# Patient Record
Sex: Female | Born: 1935
Health system: Southern US, Community
[De-identification: ages and names within clinical notes are randomized; demographics above are authoritative.]

## PROBLEM LIST (undated history)

## (undated) DIAGNOSIS — F419 Anxiety disorder, unspecified: Secondary | ICD-10-CM

## (undated) DIAGNOSIS — J189 Pneumonia, unspecified organism: Secondary | ICD-10-CM

## (undated) DIAGNOSIS — R0600 Dyspnea, unspecified: Secondary | ICD-10-CM

## (undated) DIAGNOSIS — I509 Heart failure, unspecified: Secondary | ICD-10-CM

## (undated) DIAGNOSIS — E7800 Pure hypercholesterolemia, unspecified: Secondary | ICD-10-CM

## (undated) DIAGNOSIS — J849 Interstitial pulmonary disease, unspecified: Secondary | ICD-10-CM

## (undated) DIAGNOSIS — I10 Essential (primary) hypertension: Secondary | ICD-10-CM

## (undated) DIAGNOSIS — G4730 Sleep apnea, unspecified: Secondary | ICD-10-CM

## (undated) DIAGNOSIS — I499 Cardiac arrhythmia, unspecified: Secondary | ICD-10-CM

## (undated) HISTORY — DX: Pure hypercholesterolemia, unspecified: E78.00

## (undated) HISTORY — DX: Sleep apnea, unspecified: G47.30

## (undated) HISTORY — DX: Interstitial pulmonary disease, unspecified: J84.9

## (undated) HISTORY — DX: Essential (primary) hypertension: I10

## (undated) HISTORY — PX: ACHILLES TENDON SURGERY: SHX542

## (undated) HISTORY — DX: Pneumonia, unspecified organism: J18.9

## (undated) HISTORY — DX: Anxiety disorder, unspecified: F41.9

## (undated) HISTORY — DX: Heart failure, unspecified: I50.9

## (undated) HISTORY — DX: Cardiac arrhythmia, unspecified: I49.9

## (undated) HISTORY — DX: Dyspnea, unspecified: R06.00

## (undated) HISTORY — PX: BACK SURGERY: SHX140

## (undated) HISTORY — PX: ABDOMINAL HYSTERECTOMY: SHX81

---

## 1998-08-01 ENCOUNTER — Ambulatory Visit (HOSPITAL_BASED_OUTPATIENT_CLINIC_OR_DEPARTMENT_OTHER): Admission: RE | Admit: 1998-08-01 | Discharge: 1998-08-01 | Payer: Self-pay | Admitting: Surgery

## 2004-10-03 ENCOUNTER — Inpatient Hospital Stay (HOSPITAL_COMMUNITY): Admission: RE | Admit: 2004-10-03 | Discharge: 2004-10-08 | Payer: Self-pay | Admitting: Neurosurgery

## 2006-10-02 IMAGING — CR DG CHEST 2V
2 series · 2 of 2 positions shown · non-contrast
Comparison: None.

CLINICAL DATA: Pre-admission chest x-ray for L4-5, PLIF.  
 CHEST - 2 VIEW:

[view not recorded (1 of 2)]
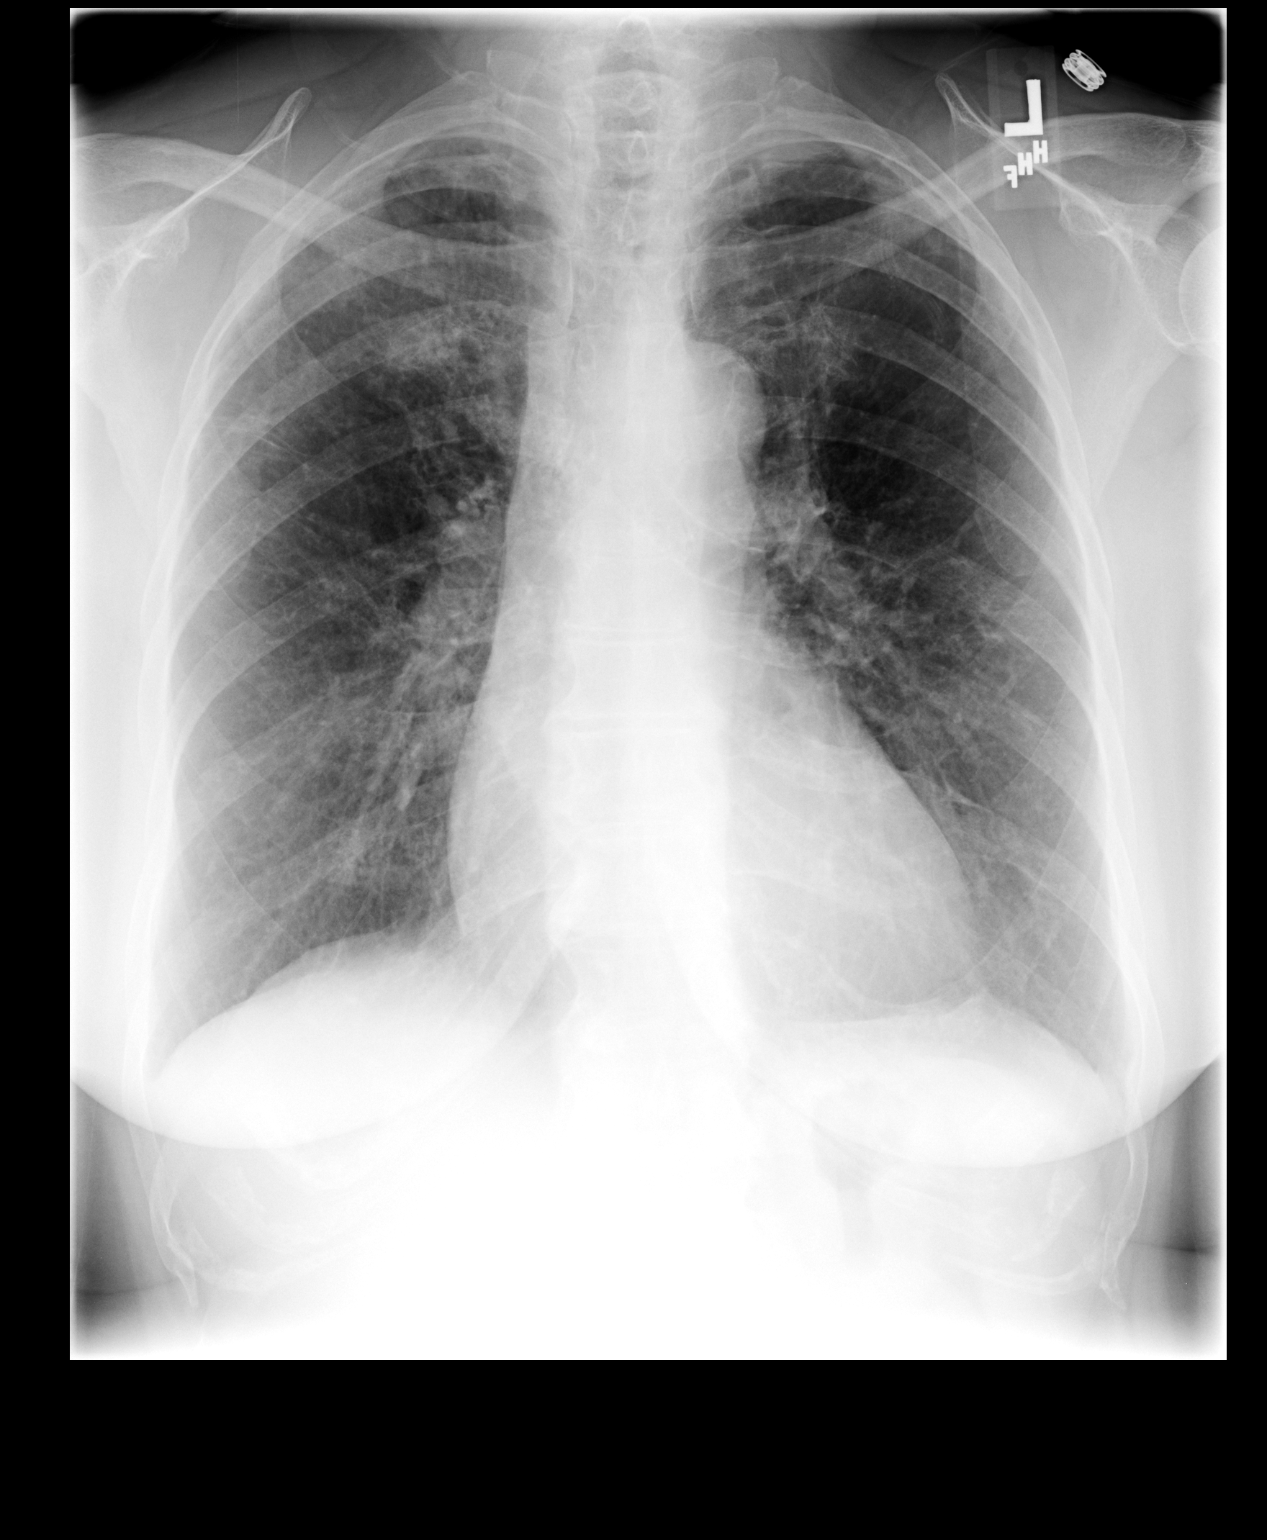

[view not recorded (2 of 2)]
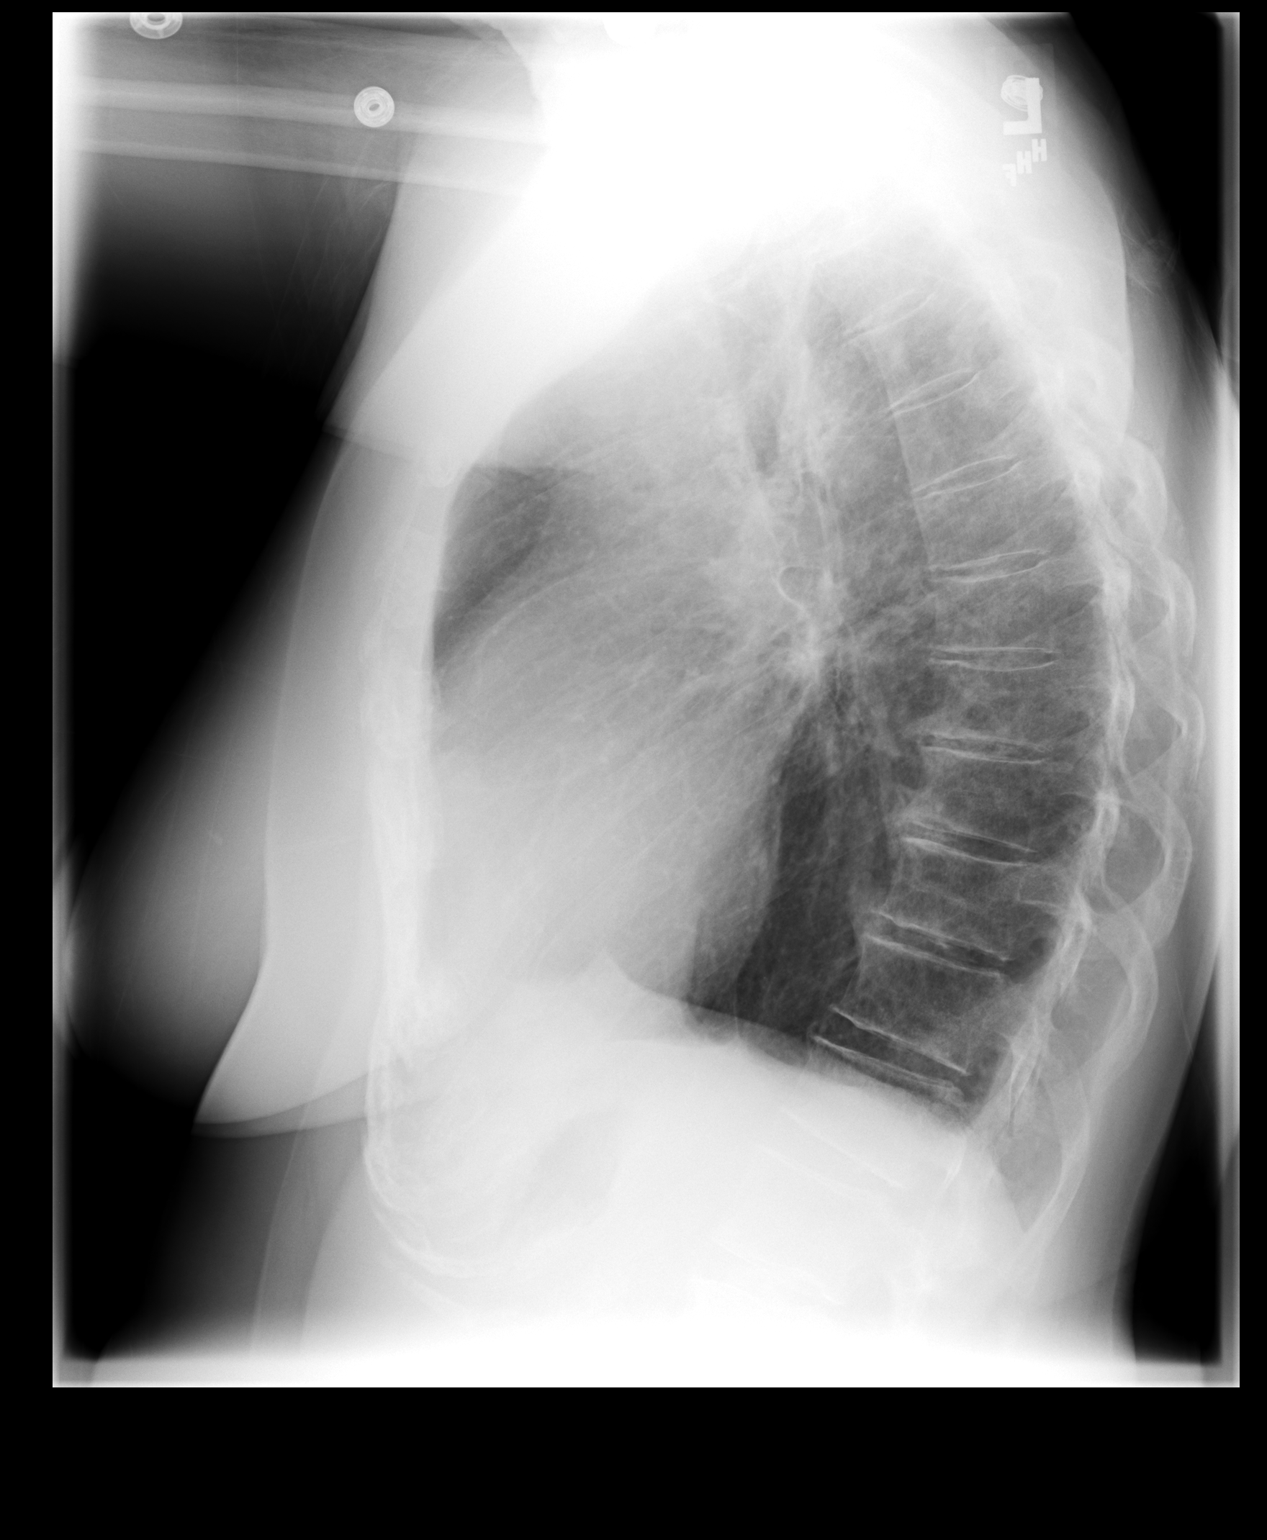

[2 of 2 positions shown; findings below may reference images not displayed]

The lungs are hyperinflated with diffuse interstitial coarsening bilaterally.  Focal area of ill-defined opacity is identified in the right upper lobe. Cardiopericardial silhouette is borderline enlarged. The bones are diffusely osteopenic.
IMPRESSION: 1.  Focal density in the right upper lobe may be related to scarring, but neoplasm is not excluded.  Comparison to previous chest x-rays is recommended and if none are available, uninfused CT scanning of the chest would be helpful to further evaluate. 
 2.  Chronic interstitial lung disease.

## 2011-04-03 ENCOUNTER — Encounter (INDEPENDENT_AMBULATORY_CARE_PROVIDER_SITE_OTHER): Payer: Self-pay | Admitting: Surgery

## 2013-10-23 ENCOUNTER — Ambulatory Visit (INDEPENDENT_AMBULATORY_CARE_PROVIDER_SITE_OTHER): Payer: Commercial Managed Care - HMO | Admitting: Cardiovascular Disease

## 2013-10-23 ENCOUNTER — Encounter: Payer: Self-pay | Admitting: Cardiovascular Disease

## 2013-10-23 VITALS — BP 160/84 | HR 59 | Ht 65.5 in | Wt 154.8 lb

## 2013-10-23 DIAGNOSIS — I4891 Unspecified atrial fibrillation: Secondary | ICD-10-CM | POA: Insufficient documentation

## 2013-10-23 LAB — BASIC METABOLIC PANEL
BUN: 19 mg/dL (ref 6–23)
CHLORIDE: 101 meq/L (ref 96–112)
CO2: 30 mEq/L (ref 19–32)
CREATININE: 1.3 mg/dL — AB (ref 0.4–1.2)
Calcium: 9.7 mg/dL (ref 8.4–10.5)
GFR: 40.48 mL/min — ABNORMAL LOW (ref >60.00)
GLUCOSE: 96 mg/dL (ref 70–99)
Potassium: 5.3 mEq/L — ABNORMAL HIGH (ref 3.5–5.1)
Sodium: 139 mEq/L (ref 135–145)

## 2013-10-23 LAB — CBC WITH DIFFERENTIAL/PLATELET
BASOS ABS: 0 10*3/uL (ref 0.0–0.1)
Basophils Relative: 0.3 % (ref 0.0–3.0)
Eosinophils Absolute: 0.1 10*3/uL (ref 0.0–0.7)
Eosinophils Relative: 0.9 % (ref 0.0–5.0)
HEMATOCRIT: 40.3 % (ref 36.0–46.0)
HEMOGLOBIN: 13.3 g/dL (ref 12.0–15.0)
LYMPHS ABS: 1.9 10*3/uL (ref 0.7–4.0)
Lymphocytes Relative: 18.7 % (ref 12.0–46.0)
MCHC: 33.1 g/dL (ref 30.0–36.0)
MCV: 87.2 fl (ref 78.0–100.0)
MONO ABS: 0.9 10*3/uL (ref 0.1–1.0)
Monocytes Relative: 8.8 % (ref 3.0–12.0)
Neutro Abs: 7.4 10*3/uL (ref 1.4–7.7)
Neutrophils Relative %: 71.3 % (ref 43.0–77.0)
Platelets: 280 10*3/uL (ref 150.0–400.0)
RBC: 4.63 Mil/uL (ref 3.87–5.11)
RDW: 14.7 % (ref 11.5–15.5)
WBC: 10.3 10*3/uL (ref 4.0–10.5)

## 2013-10-23 LAB — HEPATIC FUNCTION PANEL
ALBUMIN: 3.8 g/dL (ref 3.5–5.2)
ALT: 29 U/L (ref 0–35)
AST: 27 U/L (ref 0–37)
Alkaline Phosphatase: 48 U/L (ref 39–117)
BILIRUBIN DIRECT: 0 mg/dL (ref 0.0–0.3)
BILIRUBIN TOTAL: 0.7 mg/dL (ref 0.2–1.2)
TOTAL PROTEIN: 7.3 g/dL (ref 6.0–8.3)

## 2013-10-23 LAB — TSH: TSH: 2.18 u[IU]/mL (ref 0.35–4.50)

## 2013-10-23 NOTE — Patient Instructions (Addendum)
Your physician has recommended you make the following change in your medication:  STOP Amiodarone  Your physician recommends that you have lab work:  TODAY  Your physician recommends that you schedule a follow-up appointment in: 4-6 weeks - Tuesday June 30 at 4:30 pm.

## 2013-10-23 NOTE — Progress Notes (Signed)
     [place] Date of Birth  1932-04-01       [facility]    [employer] [address] [place], [place], [place]  [address]   [place], [place]  [address] [address] [place]   Fax  [phone]     Fax [phone]  Problem List: 1. Atrial fibrillation 2. Hypertension  3. Hyperlipidemia  History of Present Illness:  Rebecca Robinson presents in transition form [place] for treatment of her Atrial fib.    She has had 2 cardioversions - 1 without any antiarrhythmics, then later with amiodarone.    She now complains of having no energy.   She was not on any cardiac meds prior to her cardioversion.     She was very active before getting diagnosed with atrial fib.   She had occasional episodes of dyspnea, sweats ( which was probably atrial fib)   Since the dx, she has been on several meds and feels terrible.  She feels like a zombi.   She has felt very poorly for the past 3 weeks..    She has some Cp. She was hospitalized in [place] - stress myoview was negative.    She has tried diltiazem and digoxin but these have been stopped.   nonsmoker  FHx:  negative for heart problems.     Current Outpatient Prescriptions  Medication Sig Dispense Refill  . amiodarone (PACERONE) 200 MG tablet       . carvedilol (COREG) 3.125 MG tablet       . ELIQUIS 5 MG TABS tablet       . lisinopril-hydrochlorothiazide (PRINZIDE,ZESTORETIC) 10-12.5 MG per tablet       . pravastatin (PRAVACHOL) 80 MG tablet        No current facility-administered medications for this visit.     Allergies  Allergen Reactions  . Lipitor [Atorvastatin]   . Niacin And Related     No past medical history on file.  No past surgical history on file.  History  Smoking status  . Never Smoker   Smokeless tobacco  . Not on file    History  Alcohol Use: Not on file    No family history on file.  Reviw of Systems:  Reviewed in the HPI.  All other  systems are negative.  Physical Exam: Blood pressure 160/84, pulse 59, height 5' 5.5" (1.664 m), weight 154 lb 12.8 oz (70.217 kg). Wt Readings from Last 3 Encounters:  10/23/13 154 lb 12.8 oz (70.217 kg)     General: Well developed, well nourished, in no acute distress.  Head: Normocephalic, atraumatic, sclera non-icteric, mucus membranes are moist,   Neck: Supple. Carotids are 2 + without bruits. No JVD   Lungs: Clear   Heart: RR, normal S1s2  Abdomen: Soft, non-tender, non-distended with normal bowel sounds.  Msk:  Strength and tone are normal   Extremities: No clubbing or cyanosis. No edema.  Distal pedal pulses are 2+ and equal    Neuro: CN II - XII intact.  Alert and oriented X 3.   Psych:  Normal  ECG: Oct 23, 2013:  Sinus brady at 22.    Assessment / Plan:

## 2013-10-23 NOTE — Assessment & Plan Note (Signed)
Mrs. Denice Paradise presents today for further evaluation and management of her atrial fibrillation. She's been tried on diltiazem and digoxin in the past but wasn't started on amiodarone. She's had 2 cardioversions. The second cardioversion while on amiodarone with successful.  For the past several weeks she's had gradual fatigue and malaise. She does not feel like doing anything.  I suspect that this may be due to the amiodarone. We will discontinue the amiodarone and see if she gradually improved. We'll continue with rate control using Coreg. We might have to change to metoprolol for better heart rate control. She is to continue with Eliquis.    She's already had an echocardiogram and stress Myoview study performed at [facility]. We will request those records. To my knowledge, she's not received any other antiarrhythmic medication.  We'll check labs today including CBC, basic metabolic profile, TSH, and liver enzymes.

## 2013-10-26 ENCOUNTER — Telehealth: Payer: Self-pay | Admitting: Cardiovascular Disease

## 2013-10-26 NOTE — Telephone Encounter (Signed)
ROI faxed to [facility] [phone]  [phone]/kdm

## 2013-10-26 NOTE — Telephone Encounter (Signed)
Records rec From [facility] gave to Operator Room

## 2013-12-05 ENCOUNTER — Encounter: Payer: Self-pay | Admitting: Cardiovascular Disease

## 2013-12-05 ENCOUNTER — Ambulatory Visit (INDEPENDENT_AMBULATORY_CARE_PROVIDER_SITE_OTHER): Payer: Medicare HMO | Admitting: Cardiovascular Disease

## 2013-12-05 VITALS — BP 180/86 | HR 56 | Ht 65.5 in | Wt 158.0 lb

## 2013-12-05 DIAGNOSIS — I480 Paroxysmal atrial fibrillation: Secondary | ICD-10-CM

## 2013-12-05 DIAGNOSIS — I10 Essential (primary) hypertension: Secondary | ICD-10-CM

## 2013-12-05 DIAGNOSIS — I482 Chronic atrial fibrillation, unspecified: Secondary | ICD-10-CM

## 2013-12-05 DIAGNOSIS — I4891 Unspecified atrial fibrillation: Secondary | ICD-10-CM

## 2013-12-05 MED ORDER — CARVEDILOL 3.125 MG PO TABS: 3.1250 mg | ORAL_TABLET | Freq: Two times a day (BID) | ORAL | Status: DC

## 2013-12-05 MED ORDER — APIXABAN 5 MG PO TABS: 5.0000 mg | ORAL_TABLET | Freq: Two times a day (BID) | ORAL | Status: DC

## 2013-12-05 MED ORDER — LISINOPRIL-HYDROCHLOROTHIAZIDE 10-12.5 MG PO TABS: 0.5000 | ORAL_TABLET | Freq: Every day | ORAL | Status: DC

## 2013-12-05 MED ORDER — PRAVASTATIN SODIUM 80 MG PO TABS: 80.0000 mg | ORAL_TABLET | Freq: Every day | ORAL | Status: DC

## 2013-12-05 NOTE — Assessment & Plan Note (Signed)
Her blood pressures are elevated today. She eats a very high salt diet. He had long discussion regarding low-salt options. We will have her continue to follow her blood pressure. I'll see her in 3 months.

## 2013-12-05 NOTE — Patient Instructions (Addendum)
Your physician recommends that you continue on your current medications as directed. Please refer to the Current Medication list given to you today.  Your physician recommends that you schedule a follow-up appointment in: 3 months - Tuesday Sept. 22 @ 3:45 pm

## 2013-12-05 NOTE — Assessment & Plan Note (Signed)
She seems to be much better since we stopped the amiodarone. We'll continue to follow her

## 2013-12-05 NOTE — Progress Notes (Signed)
[place] Date of Birth  1931-07-10       [facility]    [employer] [address], [place], [place]  [address]   [place], [place]  [address] [place]   Fax  [phone]     Fax [phone]  Problem List: 1. Atrial fibrillation 2. Hypertension  3. Hyperlipidemia  History of Present Illness:  Ms. Trabert presents in transition form [place] for treatment of her Atrial fib.    She has had 2 cardioversions - 1 without any antiarrhythmics, then later with amiodarone.    She now complains of having no energy.   She was not on any cardiac meds prior to her cardioversion.     She was very active before getting diagnosed with atrial fib.   She had occasional episodes of dyspnea, sweats ( which was probably atrial fib)   Since the dx, she has been on several meds and feels terrible.  She feels like a zombi.   She has felt very poorly for the past 3 weeks..    She has some Cp. She was hospitalized in [place] - stress myoview was negative.    She has tried diltiazem and digoxin but these have been stopped.   nonsmoker  FHx:  negative for heart problems.    December 05, 2013:  Ms. Sammons is doing well.  Is still in NSR.    She is not exercising - she is active doing her usual activities.     Current Outpatient Prescriptions  Medication Sig Dispense Refill  . Calcium Carbonate-Vitamin D (CALCIUM-VITAMIN D) 500-200 MG-UNIT per tablet Take 1 tablet by mouth daily.      . carvedilol (COREG) 3.125 MG tablet       . ELIQUIS 5 MG TABS tablet       . lisinopril-hydrochlorothiazide (PRINZIDE,ZESTORETIC) 10-12.5 MG per tablet       . pravastatin (PRAVACHOL) 80 MG tablet        No current facility-administered medications for this visit.     Allergies  Allergen Reactions  . Lipitor [Atorvastatin]   . Niacin And Related     No past medical history on file.  No past surgical history on file.  History    Smoking status  . Never Smoker   Smokeless tobacco  . Not on file    History  Alcohol Use: Not on file    No family history on file.  Reviw of Systems:  Reviewed in the HPI.  All other systems are negative.  Physical Exam: Blood pressure 180/86, pulse 56, height 5' 5.5" (1.664 m), weight 158 lb (71.668 kg). Wt Readings from Last 3 Encounters:  12/05/13 158 lb (71.668 kg)  10/23/13 154 lb 12.8 oz (70.217 kg)     General: Well developed, well nourished, in no acute distress.  Head: Normocephalic, atraumatic, sclera non-icteric, mucus membranes are moist,   Neck: Supple. Carotids are 2 + without bruits. No JVD   Lungs: Clear   Heart: RR, normal S1s2  Abdomen: Soft, non-tender, non-distended with normal bowel sounds.  Msk:  Strength and tone are normal   Extremities: No clubbing or cyanosis. No edema.  Distal pedal pulses are 2+ and equal    Neuro: CN II - XII intact.  Alert and oriented X 3.   Psych:  Normal  ECG: Oct 23, 2013:  Sinus brady at 7.    Assessment / Plan:

## 2013-12-06 ENCOUNTER — Telehealth: Payer: Self-pay | Admitting: Cardiovascular Disease

## 2013-12-06 NOTE — Telephone Encounter (Signed)
**Note De-Identified Rebecca Robinson Obfuscation** The pts Lisinopril/HCTZ is correct in her medications list.

## 2013-12-06 NOTE — Telephone Encounter (Signed)
New Message:  Pt states she heard back from her doctor in [place] and her lisinopril Rx.. She only takes a half a pill.. Not a whole pill... Pt just wanted to give Dr. Acie Fredrickson an Juluis Rainier

## 2014-01-01 ENCOUNTER — Other Ambulatory Visit: Payer: Self-pay | Admitting: *Deleted

## 2014-01-01 MED ORDER — CARVEDILOL 3.125 MG PO TABS: 3.1250 mg | ORAL_TABLET | Freq: Two times a day (BID) | ORAL | Status: DC

## 2014-01-08 ENCOUNTER — Telehealth: Payer: Self-pay | Admitting: Cardiovascular Disease

## 2014-01-08 NOTE — Telephone Encounter (Signed)
New message    patient calling need clarification on mediation carvedilol  3.12 mg

## 2014-01-08 NOTE — Telephone Encounter (Signed)
Spoke with patient who states she took 1/2 tab of Carvedilol twice daily in the past and now her Rx bottle says to take 1 pill twice daily.  I reviewed the records that I have in patient's chart and advised her that according to her last office visit with Dr. Acie Fredrickson on 6/30, patient is supposed to take Carvedilol 3.125 mg twice daily.  I suggested that patient may have been on a higher dose sometime in the past and was told to take 1/2 a tablet when she was reduced to 3.125 mg.  Patient states she does not know but she agrees to take Carvedilol 3.125 mg twice daily.  Patient also had questions about refills sent on 6/30 and I reviewed that information with patient until she had understanding and agreement.

## 2014-02-27 ENCOUNTER — Encounter: Payer: Self-pay | Admitting: Cardiovascular Disease

## 2014-02-27 ENCOUNTER — Ambulatory Visit (INDEPENDENT_AMBULATORY_CARE_PROVIDER_SITE_OTHER): Payer: Medicare HMO | Admitting: Cardiovascular Disease

## 2014-02-27 VITALS — BP 144/86 | HR 49 | Ht 65.5 in | Wt 157.8 lb

## 2014-02-27 DIAGNOSIS — J9801 Acute bronchospasm: Secondary | ICD-10-CM

## 2014-02-27 DIAGNOSIS — I4891 Unspecified atrial fibrillation: Secondary | ICD-10-CM

## 2014-02-27 DIAGNOSIS — I482 Chronic atrial fibrillation, unspecified: Secondary | ICD-10-CM

## 2014-02-27 MED ORDER — ALBUTEROL SULFATE HFA 108 (90 BASE) MCG/ACT IN AERS: 2.0000 | INHALATION_SPRAY | Freq: Three times a day (TID) | RESPIRATORY_TRACT | Status: DC | PRN

## 2014-02-27 NOTE — Assessment & Plan Note (Signed)
She's having some bronchospasm/wheezing on the right side. We'll give her albuterol HFA 2 puffs every 8 hours as needed. She is further pulmonary issues, I have encouraged her to see her medical Dr.

## 2014-02-27 NOTE — Assessment & Plan Note (Signed)
Her atrial fibrillation seems to be well controlled.  Continue current meds.

## 2014-02-27 NOTE — Progress Notes (Signed)
[place] Date of Birth  1932-03-15       [facility]    [employer] [address] [place], [place], [place]  [address]   [place], [place]  [address] [place]   Fax  [phone]     Fax [phone]  Problem List: 1. Atrial fibrillation 2. Hypertension  3. Hyperlipidemia  History of Present Illness:  Rebecca Robinson presents in transition form [place] for treatment of her Atrial fib.    She has had 2 cardioversions - 1 without any antiarrhythmics, then later with amiodarone.    She now complains of having no energy.   She was not on any cardiac meds prior to her cardioversion.     She was very active before getting diagnosed with atrial fib.   She had occasional episodes of dyspnea, sweats ( which was probably atrial fib)   Since the dx, she has been on several meds and feels terrible.  She feels like a zombi.   She has felt very poorly for the past 3 weeks..    She has some Cp. She was hospitalized in [place] - stress myoview was negative.    She has tried diltiazem and digoxin but these have been stopped.   nonsmoker  FHx:  negative for heart problems.    December 05, 2013:  Ms. Spies is doing well.  Is still in NSR.    She is not exercising - she is active doing her usual activities.    Sept. 22,2015:  She is avoiding some extra salt reading labels .     Current Outpatient Prescriptions  Medication Sig Dispense Refill  . apixaban (ELIQUIS) 5 MG TABS tablet Take 1 tablet (5 mg total) by mouth 2 (two) times daily.  180 tablet  3  . Calcium Carbonate-Vitamin D (CALCIUM-VITAMIN D) 500-200 MG-UNIT per tablet Take 1 tablet by mouth daily.      . carvedilol (COREG) 3.125 MG tablet Take 1 tablet (3.125 mg total) by mouth 2 (two) times daily with a meal.  180 tablet  3  . lisinopril-hydrochlorothiazide (PRINZIDE,ZESTORETIC) 10-12.5 MG per tablet Take 0.5 tablets by mouth daily.  90 tablet  3  . pravastatin  (PRAVACHOL) 80 MG tablet Take 1 tablet (80 mg total) by mouth daily.  90 tablet  3   No current facility-administered medications for this visit.     Allergies  Allergen Reactions  . Lipitor [Atorvastatin]   . Niacin And Related     No past medical history on file.  No past surgical history on file.  History  Smoking status  . Never Smoker   Smokeless tobacco  . Not on file    History  Alcohol Use: Not on file    No family history on file.  Reviw of Systems:  Reviewed in the HPI.  All other systems are negative.  Physical Exam: Blood pressure 144/86, pulse 49, height 5' 5.5" (1.664 m), weight 157 lb 12.8 oz (71.578 kg), SpO2 98.00%. Wt Readings from Last 3 Encounters:  02/27/14 157 lb 12.8 oz (71.578 kg)  12/05/13 158 lb (71.668 kg)  10/23/13 154 lb 12.8 oz (70.217 kg)     General: Well developed, well nourished, in no acute distress.  Head: Normocephalic, atraumatic, sclera non-icteric, mucus membranes are moist,   Neck: Supple. Carotids are 2 + without bruits. No JVD   Lungs: wheezes, especially in the right side  Heart: RR, normal S1s2  Abdomen: Soft, non-tender, non-distended with normal bowel sounds.  Msk:  Strength and tone are normal   Extremities: No clubbing or cyanosis. No edema.  Distal pedal pulses are 2+ and equal    Neuro: CN II - XII intact.  Alert and oriented X 3.   Psych:  Normal  ECG:   Assessment / Plan:

## 2014-02-27 NOTE — Patient Instructions (Signed)
Your physician has recommended you make the following change in your medication:  START Proair inhaler 2 puffs every 8 hours as needed  Your physician wants you to follow-up in: 6 months with Dr. Acie Fredrickson.  You will receive a reminder letter in the mail two months in advance. If you don't receive a letter, please call our office to schedule the follow-up appointment.

## 2014-02-28 ENCOUNTER — Telehealth: Payer: Self-pay | Admitting: Cardiovascular Disease

## 2014-02-28 NOTE — Telephone Encounter (Signed)
ERROR

## 2014-03-29 ENCOUNTER — Other Ambulatory Visit: Payer: Self-pay | Admitting: *Deleted

## 2014-03-29 MED ORDER — PRAVASTATIN SODIUM 80 MG PO TABS: 80.0000 mg | ORAL_TABLET | Freq: Every day | ORAL | Status: DC

## 2014-08-27 ENCOUNTER — Encounter: Payer: Self-pay | Admitting: Cardiovascular Disease

## 2014-08-27 ENCOUNTER — Ambulatory Visit (INDEPENDENT_AMBULATORY_CARE_PROVIDER_SITE_OTHER): Payer: PPO | Admitting: Cardiovascular Disease

## 2014-08-27 VITALS — BP 154/80 | HR 52 | Ht 65.5 in | Wt 167.0 lb

## 2014-08-27 DIAGNOSIS — I480 Paroxysmal atrial fibrillation: Secondary | ICD-10-CM

## 2014-08-27 DIAGNOSIS — I4891 Unspecified atrial fibrillation: Secondary | ICD-10-CM

## 2014-08-27 DIAGNOSIS — I482 Chronic atrial fibrillation, unspecified: Secondary | ICD-10-CM

## 2014-08-27 DIAGNOSIS — I10 Essential (primary) hypertension: Secondary | ICD-10-CM

## 2014-08-27 NOTE — Patient Instructions (Signed)
Your physician recommends that you continue on your current medications as directed. Please refer to the Current Medication list given to you today.  Your physician has requested that you have an echocardiogram. Echocardiography is a painless test that uses sound waves to create images of your heart. It provides your doctor with information about the size and shape of your heart and how well your heart's chambers and valves are working. This procedure takes approximately one hour. There are no restrictions for this procedure.  Your physician wants you to follow-up in: 6 months with Dr. Acie Fredrickson.  You will receive a reminder letter in the mail two months in advance. If you don't receive a letter, please call our office to schedule the follow-up appointment.

## 2014-08-27 NOTE — Progress Notes (Signed)
Cardiology Office Note   Date:  08/27/2014   ID:  Rebecca Robinson, Rebecca Robinson, Rebecca Robinson  PCP:  Maris Berger, MD  Cardiologist:   Thayer Headings, MD   No chief complaint on file.  1. Atrial fibrillation 2. Hypertension  3. Hyperlipidemia  History of Present Illness:  Rebecca Robinson presents in transition form [place] for treatment of her Atrial fib. She has had 2 cardioversions - 1 without any antiarrhythmics, then later with amiodarone.   She now complains of having no energy. She was not on any cardiac meds prior to her cardioversion.    She was very active before getting diagnosed with atrial fib. She had occasional episodes of dyspnea, sweats ( which was probably atrial fib) Since the dx, she has been on several meds and feels terrible. She feels like a zombi. She has felt very poorly for the past 3 weeks..   She has some Cp. She was hospitalized in [place] - stress myoview was negative.   She has tried diltiazem and digoxin but these have been stopped.  nonsmoker  FHx: negative for heart problems.   December 05, 2013:  Rebecca Robinson is doing well. Is still in NSR. She is not exercising - she is active doing her usual activities.   Sept. 22,2015:  She is avoiding some extra salt reading labels .    August 27, 2014:    Rebecca Robinson is a 81 y.o. female who presents for follow up of her atrial fib. She has been under lots of stress. Husband has alzheimiers and she has been stressed out in managing him.  She has occasional episodes of atrial fib which makes her very short of breath.   She had an episode of severe dyspnea while getting dressed on day. The dyspnea has continued off and on.    [place]  [place] at the mall 3-4 times a week - 30-40 minutes at a time.     No past medical history on file.  No past surgical history on file.   Current Outpatient Prescriptions  Medication Sig Dispense Refill  . albuterol (PROVENTIL  HFA;VENTOLIN HFA) 108 (90 BASE) MCG/ACT inhaler Inhale 2 puffs into the lungs every 8 (eight) hours as needed for wheezing or shortness of breath. 1 Inhaler 6  . apixaban (ELIQUIS) 5 MG TABS tablet Take 1 tablet (5 mg total) by mouth 2 (two) times daily. 180 tablet 3  . Calcium Carbonate-Vitamin D (CALCIUM-VITAMIN D) 500-200 MG-UNIT per tablet Take 1 tablet by mouth daily.    . carvedilol (COREG) 3.125 MG tablet Take 1 tablet (3.125 mg total) by mouth 2 (two) times daily with a meal. 180 tablet 3  . lisinopril-hydrochlorothiazide (PRINZIDE,ZESTORETIC) 10-12.5 MG per tablet Take 0.5 tablets by mouth daily. 90 tablet 3  . pravastatin (PRAVACHOL) 80 MG tablet Take 1 tablet (80 mg total) by mouth daily. 90 tablet 3   No current facility-administered medications for this visit.    Allergies:   Lipitor and Niacin and related    Social History:  The patient  reports that she has never smoked. She does not have any smokeless tobacco history on file.   Family History:  The patient's family history is not on file.    ROS:  Please see the history of present illness.    Review of Systems: Constitutional:  denies fever, chills, diaphoresis, appetite change and fatigue.  HEENT: denies photophobia, eye pain, redness, hearing loss, ear pain, congestion, sore throat, rhinorrhea, sneezing, neck  pain, neck stiffness and tinnitus.  Respiratory: denies SOB, DOE, cough, chest tightness, and wheezing.  Cardiovascular: denies chest pain, palpitations and leg swelling.  Gastrointestinal: denies nausea, vomiting, abdominal pain, diarrhea, constipation, blood in stool.  Genitourinary: denies dysuria, urgency, frequency, hematuria, flank pain and difficulty urinating.  Musculoskeletal: denies  myalgias, back pain, joint swelling, arthralgias and gait problem.   Skin: denies pallor, rash and wound.  Neurological: denies dizziness, seizures, syncope, weakness, light-headedness, numbness and headaches.     Hematological: denies adenopathy, easy bruising, personal or family bleeding history.  Psychiatric/ Behavioral: denies suicidal ideation, mood changes, confusion, nervousness, sleep disturbance and agitation.       All other systems are reviewed and negative.    PHYSICAL EXAM: VS:  BP 154/80 mmHg  Pulse 52  Ht 5' 5.5" (1.664 m)  Wt 167 lb (75.751 kg)  BMI 27.36 kg/m2 , BMI Body mass index is 27.36 kg/(m^2). GEN: Well nourished, well developed, in no acute distress HEENT: normal Neck: no JVD, carotid bruits, or masses Cardiac: RRR; no murmurs, rubs, or gallops,no edema  Respiratory:  clear to auscultation bilaterally, normal work of breathing GI: soft, nontender, nondistended, + BS MS: no deformity or atrophy Skin: warm and dry, no rash Neuro:  Strength and sensation are intact Psych: normal   EKG:  EKG is not ordered today.    Recent Labs: 10/23/2013: ALT 29; BUN 19; Creatinine 1.3*; Hemoglobin 13.3; Platelets 280.0; Potassium 5.3*; Sodium 139; TSH 2.18    Lipid Panel No results found for: CHOL, TRIG, HDL, CHOLHDL, VLDL, LDLCALC, LDLDIRECT    Wt Readings from Last 3 Encounters:  08/27/14 167 lb (75.751 kg)  02/27/14 157 lb 12.8 oz (71.578 kg)  12/05/13 158 lb (71.668 kg)      Other studies Reviewed: Additional studies/ records that were reviewed today include: . Review of the above records demonstrates:    ASSESSMENT AND PLAN:  1. Atrial fibrillation- she continues to have palpitations. At times she has severe shortness breath. Were wondering whether or not these episodes of severe shortness of breath are associated with paroxysmal atrial fibrillation. She'll continue the same medications. I've instructed her to go to her medical doctor or come here if she has severe short of breath so that we get an EKG.  I'll see her again in 6 months.  2. Hypertension   3. Hyperlipidemia   Current medicines are reviewed at length with the patient today.  The patient  does not have concerns regarding medicines.  The following changes have been made:  no change  Labs/ tests ordered today include:  No orders of the defined types were placed in this encounter.     Disposition:   FU with me in 6 months.     Signed, Maliki Gignac, Wonda Cheng, MD  08/27/2014 3:46 PM    [address], [place], [place]  [address] Phone: [phone]; Fax: [phone]

## 2014-08-30 ENCOUNTER — Ambulatory Visit (HOSPITAL_COMMUNITY): Payer: PPO | Attending: Cardiology

## 2014-08-30 DIAGNOSIS — I10 Essential (primary) hypertension: Secondary | ICD-10-CM | POA: Insufficient documentation

## 2014-08-30 DIAGNOSIS — I4891 Unspecified atrial fibrillation: Secondary | ICD-10-CM | POA: Diagnosis not present

## 2014-08-30 DIAGNOSIS — I480 Paroxysmal atrial fibrillation: Secondary | ICD-10-CM

## 2014-08-30 NOTE — Progress Notes (Signed)
2D Echo completed. 08/30/2014 

## 2014-12-17 ENCOUNTER — Other Ambulatory Visit: Payer: Self-pay | Admitting: Cardiovascular Disease

## 2014-12-18 ENCOUNTER — Institutional Professional Consult (permissible substitution): Payer: PPO | Admitting: Pulmonary Disease

## 2014-12-18 ENCOUNTER — Other Ambulatory Visit: Payer: Self-pay | Admitting: Cardiovascular Disease

## 2014-12-22 ENCOUNTER — Other Ambulatory Visit: Payer: Self-pay | Admitting: Cardiovascular Disease

## 2014-12-31 ENCOUNTER — Telehealth: Payer: Self-pay | Admitting: Cardiovascular Disease

## 2014-12-31 NOTE — Telephone Encounter (Signed)
New Message  Pt requested to speak w/ Sharyn Lull concerning recent hopsital and what to do going forward. Please call back and discuss.

## 2014-12-31 NOTE — Telephone Encounter (Signed)
OK for patient to follow up with Dr. Bettina Gavia

## 2014-12-31 NOTE — Telephone Encounter (Signed)
Spoke with patient who states she has been in the hospital today to get her atrial fib under control and that she will have to change to cardiologist in [place] due to transportation issues.  Patient states she will be seeing Dr. Bettina Gavia in [place] but does not have an appointment yet.  I spoke with Barbette Reichmann in [facility] and she advised that his office will call for patient records when patient is scheduled.  Patient verbalized understanding and agreement and asked me to thank Dr. Acie Fredrickson for her and to tell him she appreciated his care for her over the years.

## 2015-01-07 ENCOUNTER — Ambulatory Visit (INDEPENDENT_AMBULATORY_CARE_PROVIDER_SITE_OTHER): Payer: PPO | Admitting: Pulmonary Disease

## 2015-01-07 ENCOUNTER — Encounter: Payer: Self-pay | Admitting: Pulmonary Disease

## 2015-01-07 ENCOUNTER — Ambulatory Visit (INDEPENDENT_AMBULATORY_CARE_PROVIDER_SITE_OTHER)
Admission: RE | Admit: 2015-01-07 | Discharge: 2015-01-07 | Disposition: A | Discharge status: Home / Self Care | Payer: PPO | Source: Ambulatory Visit | Attending: Pulmonary Disease | Admitting: Pulmonary Disease

## 2015-01-07 VITALS — BP 120/76 | HR 102 | Temp 97.3°F | Ht 65.5 in | Wt 168.0 lb

## 2015-01-07 DIAGNOSIS — I481 Persistent atrial fibrillation: Secondary | ICD-10-CM | POA: Diagnosis not present

## 2015-01-07 DIAGNOSIS — J8410 Pulmonary fibrosis, unspecified: Secondary | ICD-10-CM | POA: Diagnosis not present

## 2015-01-07 DIAGNOSIS — R0600 Dyspnea, unspecified: Secondary | ICD-10-CM | POA: Insufficient documentation

## 2015-01-07 DIAGNOSIS — I4819 Other persistent atrial fibrillation: Secondary | ICD-10-CM

## 2015-01-07 MED ORDER — BUDESONIDE 180 MCG/ACT IN AEPB: 2.0000 | INHALATION_SPRAY | Freq: Two times a day (BID) | RESPIRATORY_TRACT | Status: DC

## 2015-01-07 NOTE — Patient Instructions (Signed)
Vennessa- it was nice meeting you and your family today...  Today we checked a CXR here in our office so I can compare a current film to your previous studies...    We will contact you w/ the results when available...   [facility] all [facility]...  In the meanwhile I would like you to start an inhaler> PULMICORT - 2 inhalations twice daily...  Call for any questions...  Let's plan a follow up visit in 6 weeks, sooner if needed for problems...  We are hoping to have a new physician in the [facility] clinic by October & as soon as they are established we will transfer your care to that office to make your transportation easier on your family.Marland KitchenMarland Kitchen

## 2015-01-07 NOTE — Progress Notes (Signed)
Subjective:     Patient ID: Rebecca Robinson, female   DOB: 15-Jul-1931, 81 y.o.   MRN: [id number]  HPI 81 y/o WF referred by DrWhyte in [place] for evaluation of dyspnea>>       Tanai is a never smoker without prev hx of signif resp illnesses & presents w/ dyspnea/ DOE; by her hx & on review of her record- the dyspnea seems to correlate best w/ the onset of her AFib; she denies much cough or sputum, no hemoptysis; she notes occas CP- evaluated by Cards as below; denies f/c/s, appetite fair, wt stable; her CXR has shown diffuse bilat interstitial prominence & DrWhyte initiated an ILD work-up but to my review this appears to be chronic & visible/ similar on old films back to 2006=> I suspect old post-inflammatory fibrosis poss related to old infections but there does not appear to be anything active; Radiology has questioned poss Sarcoid but the Lisinopril negates our only blood test of dis activity & if she has this then it appears to be old/ burnt-out/ inactive disease; only other option would be to consider lung bx & I would like to hold off on invasive work up in this 81 y/o woman (now on Eliquis for her AFib)...       She has difficult to control AFib> seen by DrNasher (last 08/2014) who felt that her AFib episodes correlated pretty well w/ her dypnea (she was very active before the onset of the AFib); she has had several cardioversions in past & transiently on Amio; she has been c/o palpit, some CP, feeling poorly, no energy, under a lot of stress caring for husb w/ Alz; stress myoview in [place] was neg; she was walking regularly at the mall but stopped again when she went back in AFib;  Now followed by [facility] in [place] on Eliquis5Bid, Coreg 6.25Bid, Cardizem30Tid, LisinHCT20-12.5 taking 1/2 daily; she is still in Afib w/ rapid VR (pulse 102 & irreg) and symptomatic as noted....   EXAM reveals Afeb, VSS, O2sat=97% on RA at rest;  HEENT- neg, mallampati2;  Chest- clear, w/o w/r/r heard;  Heart-  irreg AFib, gr1/6 SEM w/o r/g heard;  Abd- soft, neg;  Wxt- w/o c/c/e...  CXRs from 10/14, & as far back as 4/06 all look similar to my review> biapical pleuroparenchymal scarring, diffuse interstitial coarsening bilat, ill defined RUL opacity...  2DEcho 08/30/14 showed norm heart size & funct w/ EF=55-60% & no regional wall motion abn, trivAI, mild RAdil, RV wnl & main PA is norm sized w/ norm PAsys...  Spirometry 11/27/14 in [place] showed FVC=1.69 (65%), FEV1=1.07 (56%), %1sec=64 and mid-flows reduced at 34% predicted; c/w a mod obstructive ventilatory defect, can't r/o superimposed restriction w/o lung volume measurement.  CXR 6=>8/16 showed diffuse bilat interstitial prominence again noted in upper lobes, pleuroparenchymal scarring stable/ NAD...  LABS 6/16 in [place] by DrWhyte> ACE<15 but pt on Lisinopril rx; RA latex- neg, CCP antibodies- neg, Sjogren's Ab- neg,   Hi-res CT Chest 12/25/14 showed atherosclerosis of Ao/ great vessels/ coronaries, dilatation of pulm trunk at 3.4cm (r/o PAH but 2DEcho 3/16 had norm pulm pressure), mid-upper lobe predom ground-glass attenuation & thickening of the peribronchovasc interstitium  & apical pleuroparenchymal thickening, mild traction bronchiectasis & micronodularity, borderline mediastinal nodes (many are densely calcif)- radiology favors sarcoid, HP, NSIP.Marland KitchenMarland Kitchen                        CXR 01/07/15    IMP/PLAN>>  57 y/o  woman from [place] who presents w/ dyspnea and an abn CXR/ CT scan;  The dyspnea appears to clinically correlate well w/ her AFib episodes (currently in Fib w/ recent Hosp at [place] added by Cards);  She has CXR changes going back to our oldest avail film in 2006 and current CT Chest is her 1st scan w/ upper lobe predom pleuroparenchymal scarring and nodularity that I suspect is post inflammatory fibrosis and doubt an active process;  We discussed Rx w/ an ICS- try PULMICORT 2 inhalations Bid & we will continue  to follow for CXR/ CT changes over time (anticipate f/u CT in 6-12 mo depending on symptoms);  In addition we will plan f/u w/ Full PFTs including lung volumes and DLCO for further eval... (BTW her 2DEcho 08/2014 was OK w/ norm RV & norm PAsys est- DrMunley is planning a 2DEcho in [place] soon).    Past Medical History  Diagnosis Date  . Hypertension   . Abnormal heart rhythm   . High cholesterol     Past Surgical History  Procedure Laterality Date  . Back surgery    . Abdominal hysterectomy      Outpatient Encounter Prescriptions as of 01/07/2015  Medication Sig  . Calcium Carbonate-Vitamin D (CALCIUM-VITAMIN D) 500-200 MG-UNIT per tablet Take 1 tablet by mouth daily.  . carvedilol (COREG) 3.125 MG tablet Take 1 tablet (3.125 mg total) by mouth 2 (two) times daily with a meal. (Patient taking differently: Take 6.25 mg by mouth 2 (two) times daily with a meal. )  . diltiazem (CARDIZEM) 30 MG tablet Take 30 mg by mouth 3 (three) times daily.  Marland Kitchen ELIQUIS 5 MG TABS tablet TAKE ONE TABLET BY MOUTH TWICE DAILY  . lisinopril-hydrochlorothiazide (PRINZIDE,ZESTORETIC) 10-12.5 MG per tablet TAKE ONE-HALF TABLET BY MOUTH ONCE DAILY  . pravastatin (PRAVACHOL) 80 MG tablet Take 1 tablet (80 mg total) by mouth daily.  Marland Kitchen albuterol (PROVENTIL HFA;VENTOLIN HFA) 108 (90 BASE) MCG/ACT inhaler Inhale 2 puffs into the lungs every 8 (eight) hours as needed for wheezing or shortness of breath. (Patient not taking: Reported on 01/07/2015)    Allergies  Allergen Reactions  . Amiodarone   . Lipitor [Atorvastatin]   . Niacin And Related     Family History  Problem Relation Age of Onset  . Emphysema Father   . Ovarian cancer Mother   . Breast cancer Mother     History   Social History  . Marital Status: Single    Spouse Name: N/A  . Number of Children: N/A  . Years of Education: N/A   Occupational History  . house wife    Social History Main Topics  . Smoking status: Never Smoker   . Smokeless  tobacco: Not on file  . Alcohol Use: 0.6 oz/week    1 Standard drinks or equivalent per week  . Drug Use: No  . Sexual Activity: Not on file   Other Topics Concern  . Not on file   Social History Narrative    Current Medications, Allergies, Past Medical History, Past Surgical History, Family History, and Social History were reviewed in [employer] record.   Review of Systems            All symptoms NEG except where BOLDED >>  Constitutional:  F/C/S, fatigue, anorexia, unexpected weight change. HEENT:  HA, visual changes, hearing loss, earache, nasal symptoms, sore throat, mouth sores, hoarseness. Resp:  cough, sputum, hemoptysis; SOB, tightness, wheezing. Cardio:  CP, palpit, DOE, orthopnea, edema. GI:  N/V/D/C, blood in stool; reflux, abd pain, distention, gas. GU:  dysuria, freq, urgency, hematuria, flank pain, voiding difficulty. MS:  joint pain, swelling, tenderness, decr ROM; neck pain, back pain, etc. Neuro:  HA, tremors, seizures, dizziness, syncope, weakness, numbness, gait abn. Skin:  suspicious lesions or skin rash. Heme:  adenopathy, bruising, bleeding. Psyche:  confusion, agitation, sleep disturbance, hallucinations, anxiety, depression suicidal.   Objective:   Physical Exam      Vital Signs:  Reviewed...  General:  WD, WN, 81 y/o WF in NAD; alert & oriented; pleasant & cooperative; she is sl anxious... HEENT:  [place]/AT; Conjunctiva- pink, Sclera- nonicteric, EOM-wnl, PERRLA, EACs-clear, TMs-wnl; NOSE-clear; THROAT-clear & wnl. Neck:  Supple w/ fair ROM; no JVD; normal carotid impulses w/o bruits; no thyromegaly or nodules palpated; no lymphadenopathy. Chest:  Clear to P & A; without wheezes, rales, or rhonchi heard. Heart:  Irregular Rhythm- AFib w/ rate>100; without murmurs, rubs, or gallops detected. Abdomen:  Soft & nontender- no guarding or rebound; normal bowel sounds; no organomegaly or masses palpated. Ext:  Normal ROM; without  deformities +arthritic changes; no varicose veins, venous insuffic, or edema;  Pulses intact w/o bruits. Neuro:  No focal neuro deficits; sensory testing normal; gait normal & balance are fair (weakness). Derm:  No lesions noted; no rash etc. Lymph:  No cervical, supraclavicular, axillary, or inguinal adenopathy palpated.   Assessment:      IMP >>     Dyspnea     Post inflammatory pulmonary fibrosis    Obstructive +/- restrictive lung disease    HBP    Atrial Fibrillation     Hyperlipidemia  PLAN >>  84 y/o woman from [place] who presents w/ dyspnea and an abn CXR/ CT scan;  The dyspnea appears to clinically correlate well w/ her AFib episodes (currently in Fib w/ recent Hosp at [place] added by Cards);  She has CXR changes going back to our oldest avail film in 2006 and current CT Chest is her 1st scan w/ upper lobe predom pleuroparenchymal scarring and nodularity that I suspect is post inflammatory fibrosis and doubt an active process;  We discussed Rx w/ an ICS- try PULMICORT 2 inhalations Bid & we will continue to follow for CXR/ CT changes over time (anticipate f/u CT in 6-12 mo depending on symptoms);  In addition we will plan f/u w/ Full PFTs including lung volumes and DLCO for further eval     Plan:     Patient's Medications  New Prescriptions   BUDESONIDE (PULMICORT) 180 MCG/ACT INHALER    Inhale 2 puffs into the lungs 2 (two) times daily.  Previous Medications   ALBUTEROL (PROVENTIL HFA;VENTOLIN HFA) 108 (90 BASE) MCG/ACT INHALER    Inhale 2 puffs into the lungs every 8 (eight) hours as needed for wheezing or shortness of breath.   CALCIUM CARBONATE-VITAMIN D (CALCIUM-VITAMIN D) 500-200 MG-UNIT PER TABLET    Take 1 tablet by mouth daily.   CARVEDILOL (COREG) 3.125 MG TABLET    Take 1 tablet (3.125 mg total) by mouth 2 (two) times daily with a meal.   DILTIAZEM (CARDIZEM) 30 MG TABLET    Take 30 mg by mouth 3 (three) times daily.   ELIQUIS 5 MG  TABS TABLET    TAKE ONE TABLET BY MOUTH TWICE DAILY   LISINOPRIL-HYDROCHLOROTHIAZIDE (PRINZIDE,ZESTORETIC) 10-12.5 MG PER TABLET    TAKE ONE-HALF TABLET BY MOUTH ONCE DAILY   PRAVASTATIN (PRAVACHOL) 80  MG TABLET    Take 1 tablet (80 mg total) by mouth daily.  Modified Medications   No medications on file  Discontinued Medications   No medications on file

## 2015-02-04 ENCOUNTER — Other Ambulatory Visit: Payer: Self-pay | Admitting: Cardiovascular Disease

## 2015-02-18 ENCOUNTER — Ambulatory Visit (INDEPENDENT_AMBULATORY_CARE_PROVIDER_SITE_OTHER): Payer: PPO | Admitting: Pulmonary Disease

## 2015-02-18 ENCOUNTER — Encounter: Payer: Self-pay | Admitting: Pulmonary Disease

## 2015-02-18 VITALS — BP 136/80 | HR 83 | Temp 97.7°F | Wt 169.0 lb

## 2015-02-18 DIAGNOSIS — I481 Persistent atrial fibrillation: Secondary | ICD-10-CM

## 2015-02-18 DIAGNOSIS — R0600 Dyspnea, unspecified: Secondary | ICD-10-CM

## 2015-02-18 DIAGNOSIS — J8410 Pulmonary fibrosis, unspecified: Secondary | ICD-10-CM

## 2015-02-18 DIAGNOSIS — I4819 Other persistent atrial fibrillation: Secondary | ICD-10-CM

## 2015-02-18 MED ORDER — BUDESONIDE 180 MCG/ACT IN AEPB: 2.0000 | INHALATION_SPRAY | Freq: Two times a day (BID) | RESPIRATORY_TRACT | Status: DC

## 2015-02-18 NOTE — Progress Notes (Signed)
Subjective:     Patient ID: Rebecca Robinson, female   DOB: 1931-08-05, 81 y.o.   MRN: [id number]  HPI ~  January 07, 2015:  Initial pulmonary consult w/ SN>   71 y/o WF referred by DrWhyte in [place] for evaluation of dyspnea>>       Rebecca Robinson is a never smoker without prev hx of signif resp illnesses & presents w/ dyspnea/ DOE; by her hx & on review of her record- the dyspnea seems to correlate best w/ the onset of her AFib; she denies much cough or sputum, no hemoptysis; she notes occas CP- evaluated by Cards as below; denies f/c/s, appetite fair, wt stable; her CXR has shown diffuse bilat interstitial prominence & DrWhyte initiated an ILD work-up but to my review this appears to be chronic & visible/ similar on old films back to 2006=> I suspect old post-inflammatory fibrosis poss related to old infections but there does not appear to be anything active; Radiology has questioned poss Sarcoid but the Lisinopril negates our only blood test of dis activity & if she has this then it appears to be old/ burnt-out/ inactive disease; only other option would be to consider lung bx & I would like to hold off on invasive work up in this 81 y/o woman (now on Eliquis for her AFib)...       She has difficult to control AFib> seen by DrNasher (last 08/2014) who felt that her AFib episodes correlated pretty well w/ her dypnea (she was very active before the onset of the AFib); she has had several cardioversions in past & transiently on Amio; she has been c/o palpit, some CP, feeling poorly, no energy, under a lot of stress caring for husb w/ Alz; stress myoview in [place] was neg; she was walking regularly [place] the mall but stopped again when she went back in AFib;  Now followed by [facility] in [place] on Eliquis5Bid, Coreg 6.25Bid, Cardizem30Tid, LisinHCT20-12.5 taking 1/2 daily; she is still in Afib w/ rapid VR (pulse 102 & irreg) and symptomatic as noted....       EXAM reveals Afeb, VSS, O2sat=97% on RA at rest;   HEENT- neg, mallampati2;  Chest- clear, w/o w/r/r heard;  Heart- irreg AFib, gr1/6 SEM w/o r/g heard;  Abd- soft, neg;  Wxt- w/o c/c/e...  CXRs from 10/14, & as far back as 4/06 all look similar to my review> biapical pleuroparenchymal scarring, diffuse interstitial coarsening bilat, ill defined RUL opacity...  2DEcho 08/30/14 showed norm heart size & funct w/ EF=55-60% & no regional wall motion abn, trivAI, mild RAdil, RV wnl & main PA is norm sized w/ norm PAsys...  Spirometry 11/27/14 in [place] showed FVC=1.69 (65%), FEV1=1.07 (56%), %1sec=64 and mid-flows reduced at 34% predicted; c/w a mod obstructive ventilatory defect, can't r/o superimposed restriction w/o lung volume measurement.  CXR 6=>8/16 showed diffuse bilat interstitial prominence again noted in upper lobes, pleuroparenchymal scarring stable/ NAD...  LABS 6/16 in [place] by DrWhyte> ACE<15 but pt on Lisinopril rx; RA latex- neg, CCP antibodies- neg, Sjogren's Ab- neg,   Hi-res CT Chest 12/25/14 showed atherosclerosis of Ao/ great vessels/ coronaries, dilatation of pulm trunk at 3.4cm (r/o PAH but 2DEcho 3/16 had norm pulm pressure), mid-upper lobe predom ground-glass attenuation & thickening of the peribronchovasc interstitium  & apical pleuroparenchymal thickening, mild traction bronchiectasis & micronodularity, borderline mediastinal nodes (many are densely calcif)- radiology favors sarcoid, HP, NSIP.Marland KitchenMarland Kitchen  CXR 01/07/15                                             CT Chest 12/25/14     IMP/PLAN>>  26 y/o woman from [place] (Rebecca Robinson) who presents w/ dyspnea and an abn CXR/ CT scan;  The dyspnea appears to clinically correlate well w/ her AFib episodes (currently in Fib w/ recent Hosp at [place] added by Cards);  She has CXR changes going back to our oldest avail film in 2006 and current CT Chest is her 1st scan w/ upper lobe predom pleuroparenchymal scarring and nodularity that I suspect is post  inflammatory fibrosis and doubt an active process;  We discussed Rx w/ an ICS- try PULMICORT 2 inhalations Bid & we will continue to follow for CXR/ CT changes over time (anticipate f/u CT in 6-12 mo depending on symptoms);  In addition we will plan f/u w/ Full PFTs including lung volumes and DLCO for further eval... (BTW her 2DEcho 08/2014 was OK w/ norm RV & norm PAsys est- DrMunley is planning a 2DEcho in [place] soon).   ~  February 18, 2015:  6wk ROV w/ SN>   35 y/o WF followed by DrWhyte, PCP and DrMunley/[place], [facility] in [place]>  She reports f/u w/ Cards (note not avail to review) & they started Cardizem 30mg  Tid, still in AFib;  She notes that her SOB is slightly better- noticed esp at night- not wheezing, not stopped up, etc;  Still notes SOB/DOE w/ min activity & ADLs but she is not exercising & needs to start an exercise program...    Dyspnea> she is a never smoker & onset of SOB seemed to coincide w/ onset of AFib, and she is still in AFib & feels SOB- eval & rx from Cards in [place], [place]...    Prob post inflammatory pulmonary fibrosis> she denies much cough, sput, & no hemoptysis; current CXR is similar to old films as far back as 2006 w/ bi-apical pleuroparenchymal scarring & diffuse interstitial coarsening; but Hi-res CTChest 12/25/14 showed mid-upper lobe predom ground-glass attenuation & thickening of the peribronchovasc interstitium & apical pleuroparenchymal thickening, mild traction bronchiectasis & micronodularity, borderline mediastinal nodes (many are densely calcif)- Radiology favors sarcoid, HP, NSIP;  We started PULMICORT-2spBid & she has ProairHFA for prn use...    Obstructive +/- restrictive lung disease> PFT w/ combined obstructive & restrictive ventilatory defects, but she needs Full PFTs w/ lung volumes and DLCO...    HBP> on Cardizem30Tid, Coreg3.125Bid, LisinHCT10-12.5 taking 1/2 tab    Prob CAD> with atherosclerotic changes in Ao/ great vessels/ coronaries noted  on CT Chest...    Atrial Fibrillation w/ controlled VR> on above meds and Eliquis5mg Bid & followed in [place]...    Hyperlipidemia> on Prav80... EXAM shows Afeb, VSS, O2sat=93% on RA;  HEENT- neg, mallampati2;  Chest- clear, w/o w/r/r heard;  Heart- irreg AFib, gr1/6 SEM w/o r/g heard;  Abd- soft, neg;  Wxt- w/o c/c/e. IMP/PLAN>>  Shawn[place] is sl better on the Pulmicort; she is rec to gradually increase her exercise program to yield even further improvement; we discussed the need for FullPFTs but she wants to wait & prefers f/u in [place] [facility] when avail...    Past Medical History  Diagnosis Date  . Hypertension   . Abnormal heart rhythm   . High cholesterol  Past Surgical History  Procedure Laterality Date  . Back surgery    . Abdominal hysterectomy      Outpatient Encounter Prescriptions as of 02/18/2015  Medication Sig  . albuterol (PROVENTIL HFA;VENTOLIN HFA) 108 (90 BASE) MCG/ACT inhaler Inhale 2 puffs into the lungs every 8 (eight) hours as needed for wheezing or shortness of breath.  . budesonide (PULMICORT) 180 MCG/ACT inhaler Inhale 2 puffs into the lungs 2 (two) times daily.  . Calcium Carbonate-Vitamin D (CALCIUM-VITAMIN D) 500-200 MG-UNIT per tablet Take 1 tablet by mouth daily.  . carvedilol (COREG) 3.125 MG tablet TAKE ONE TABLET BY MOUTH TWICE DAILY WITH MEALS  . ELIQUIS 5 MG TABS tablet TAKE ONE TABLET BY MOUTH TWICE DAILY  . lisinopril-hydrochlorothiazide (PRINZIDE,ZESTORETIC) 10-12.5 MG per tablet TAKE ONE-HALF TABLET BY MOUTH ONCE DAILY  . pravastatin (PRAVACHOL) 80 MG tablet Take 1 tablet (80 mg total) by mouth daily.  . [DISCONTINUED] budesonide (PULMICORT) 180 MCG/ACT inhaler Inhale 2 puffs into the lungs 2 (two) times daily.  Marland Kitchen diltiazem (CARDIZEM) 30 MG tablet Take 30 mg by mouth 3 (three) times daily.   No facility-administered encounter medications on file as of 02/18/2015.    Allergies  Allergen Reactions  . Amiodarone   . Lipitor [Atorvastatin]     . Niacin And Related     Current Medications, Allergies, Past Medical History, Past Surgical History, Family History, and Social History were reviewed in [employer] record.   Review of Systems            All symptoms NEG except where BOLDED >>  Constitutional:  F/C/S, fatigue, anorexia, unexpected weight change. HEENT:  HA, visual changes, hearing loss, earache, nasal symptoms, sore throat, mouth sores, hoarseness. Resp:  cough, sputum, hemoptysis; SOB, tightness, wheezing. Cardio:  CP, palpit, DOE, orthopnea, edema. GI:  N/V/D/C, blood in stool; reflux, abd pain, distention, gas. GU:  dysuria, freq, urgency, hematuria, flank pain, voiding difficulty. MS:  joint pain, swelling, tenderness, decr ROM; neck pain, back pain, etc. Neuro:  HA, tremors, seizures, dizziness, syncope, weakness, numbness, gait abn. Skin:  suspicious lesions or skin rash. Heme:  adenopathy, bruising, bleeding. Psyche:  confusion, agitation, sleep disturbance, hallucinations, anxiety, depression suicidal.   Objective:   Physical Exam      Vital Signs:  Reviewed...  General:  WD, WN, 81 y/o WF in NAD; alert & oriented; pleasant & cooperative; she is sl anxious... HEENT:  [place]/AT; Conjunctiva- pink, Sclera- nonicteric, EOM-wnl, PERRLA, EACs-clear, TMs-wnl; NOSE-clear; THROAT-clear & wnl. Neck:  Supple w/ fair ROM; no JVD; normal carotid impulses w/o bruits; no thyromegaly or nodules palpated; no lymphadenopathy. Chest:  Clear to P & A; without wheezes, rales, or rhonchi heard. Heart:  Irregular Rhythm- AFib w/ rate>100; without murmurs, rubs, or gallops detected. Abdomen:  Soft & nontender- no guarding or rebound; normal bowel sounds; no organomegaly or masses palpated. Ext:  Normal ROM; without deformities +arthritic changes; no varicose veins, venous insuffic, or edema;  Pulses intact w/o bruits. Neuro:  No focal neuro deficits; sensory testing normal; gait normal & balance are fair  (weakness). Derm:  No lesions noted; no rash etc. Lymph:  No cervical, supraclavicular, axillary, or inguinal adenopathy palpated.   Assessment:      IMP >>     Dyspnea     Post inflammatory pulmonary fibrosis    Obstructive +/- restrictive lung disease    HBP    Prob CAD    Atrial Fibrillation     Hyperlipidemia  PLAN >>  8/1>  89 y/o woman from [place] (Rebecca Robinson) who presents w/ dyspnea and an abn CXR/ CT scan;  The dyspnea appears to clinically correlate well w/ her AFib episodes (currently in Fib w/ recent Hosp at [place] added by Cards);  She has CXR changes going back to our oldest avail film in 2006 and current CT Chest is her 1st scan w/ upper lobe predom pleuroparenchymal scarring and nodularity that I suspect is post inflammatory fibrosis and doubt an active process;  We discussed Rx w/ an ICS- try PULMICORT 2 inhalations Bid & we will continue to follow for CXR/ CT changes over time (anticipate f/u CT in 6-12 mo depending on symptoms);  In addition we will plan f/u w/ Full PFTs including lung volumes and DLCO for further eval 9/12>  [place] is sl better on the Pulmicort; she is rec to gradually increase her exercise program to yield even further improvement; we discussed the need for FullPFTs but she wants to wait & prefers f/u in [place] clinic when avail     Plan:     Patient's Medications  New Prescriptions   No medications on file  Previous Medications   ALBUTEROL (PROVENTIL HFA;VENTOLIN HFA) 108 (90 BASE) MCG/ACT INHALER    Inhale 2 puffs into the lungs every 8 (eight) hours as needed for wheezing or shortness of breath.   CALCIUM CARBONATE-VITAMIN D (CALCIUM-VITAMIN D) 500-200 MG-UNIT PER TABLET    Take 1 tablet by mouth daily.   CARVEDILOL (COREG) 3.125 MG TABLET    TAKE ONE TABLET BY MOUTH TWICE DAILY WITH MEALS   DILTIAZEM (CARDIZEM) 30 MG TABLET    Take 30 mg by mouth 3 (three) times daily.   ELIQUIS 5 MG TABS TABLET    TAKE ONE TABLET BY  MOUTH TWICE DAILY   LISINOPRIL-HYDROCHLOROTHIAZIDE (PRINZIDE,ZESTORETIC) 10-12.5 MG PER TABLET    TAKE ONE-HALF TABLET BY MOUTH ONCE DAILY   PRAVASTATIN (PRAVACHOL) 80 MG TABLET    Take 1 tablet (80 mg total) by mouth daily.  Modified Medications   Modified Medication Previous Medication   BUDESONIDE (PULMICORT) 180 MCG/ACT INHALER budesonide (PULMICORT) 180 MCG/ACT inhaler      Inhale 2 puffs into the lungs 2 (two) times daily.    Inhale 2 puffs into the lungs 2 (two) times daily.  Discontinued Medications   No medications on file

## 2015-02-18 NOTE — Patient Instructions (Signed)
Today we updated your med list in our EPIC system...    Continue your current medications the same...    Continue the Pulmicort inhaler- 2 sprays twice daily...   Gradually increase your exercise to improve your stamina...  Call for any questions...  Let's plan a follow up visit in 3-54months, sooner if needed for problems.Marland Kitchen.    (we may be able to schedule your follow up in the [place] clinic)

## 2015-03-07 ENCOUNTER — Other Ambulatory Visit: Payer: Self-pay | Admitting: Cardiovascular Disease

## 2015-04-05 ENCOUNTER — Other Ambulatory Visit: Payer: Self-pay | Admitting: Cardiovascular Disease

## 2015-05-20 ENCOUNTER — Ambulatory Visit: Payer: PPO | Admitting: Pulmonary Disease

## 2015-06-24 DIAGNOSIS — Z79899 Other long term (current) drug therapy: Secondary | ICD-10-CM | POA: Diagnosis not present

## 2015-06-24 DIAGNOSIS — I1310 Hypertensive heart and chronic kidney disease without heart failure, with stage 1 through stage 4 chronic kidney disease, or unspecified chronic kidney disease: Secondary | ICD-10-CM | POA: Diagnosis not present

## 2015-06-24 DIAGNOSIS — F321 Major depressive disorder, single episode, moderate: Secondary | ICD-10-CM | POA: Diagnosis not present

## 2015-06-24 DIAGNOSIS — E785 Hyperlipidemia, unspecified: Secondary | ICD-10-CM | POA: Diagnosis not present

## 2015-06-24 DIAGNOSIS — N183 Chronic kidney disease, stage 3 (moderate): Secondary | ICD-10-CM | POA: Diagnosis not present

## 2015-07-02 DIAGNOSIS — J849 Interstitial pulmonary disease, unspecified: Secondary | ICD-10-CM | POA: Diagnosis not present

## 2015-07-02 DIAGNOSIS — I5181 Takotsubo syndrome: Secondary | ICD-10-CM | POA: Diagnosis not present

## 2015-07-02 DIAGNOSIS — E7800 Pure hypercholesterolemia, unspecified: Secondary | ICD-10-CM | POA: Diagnosis not present

## 2015-07-02 DIAGNOSIS — I129 Hypertensive chronic kidney disease with stage 1 through stage 4 chronic kidney disease, or unspecified chronic kidney disease: Secondary | ICD-10-CM | POA: Diagnosis not present

## 2015-07-02 DIAGNOSIS — I481 Persistent atrial fibrillation: Secondary | ICD-10-CM | POA: Diagnosis not present

## 2015-07-02 DIAGNOSIS — D869 Sarcoidosis, unspecified: Secondary | ICD-10-CM | POA: Diagnosis not present

## 2015-07-02 DIAGNOSIS — I480 Paroxysmal atrial fibrillation: Secondary | ICD-10-CM | POA: Diagnosis not present

## 2015-07-04 DIAGNOSIS — G4733 Obstructive sleep apnea (adult) (pediatric): Secondary | ICD-10-CM | POA: Diagnosis not present

## 2015-07-04 DIAGNOSIS — J849 Interstitial pulmonary disease, unspecified: Secondary | ICD-10-CM | POA: Diagnosis not present

## 2015-07-04 DIAGNOSIS — J8410 Pulmonary fibrosis, unspecified: Secondary | ICD-10-CM | POA: Diagnosis not present

## 2015-07-04 DIAGNOSIS — I482 Chronic atrial fibrillation: Secondary | ICD-10-CM | POA: Diagnosis not present

## 2015-07-04 DIAGNOSIS — I5181 Takotsubo syndrome: Secondary | ICD-10-CM | POA: Diagnosis not present

## 2015-07-09 DIAGNOSIS — I480 Paroxysmal atrial fibrillation: Secondary | ICD-10-CM | POA: Diagnosis not present

## 2015-07-09 DIAGNOSIS — I5181 Takotsubo syndrome: Secondary | ICD-10-CM | POA: Diagnosis not present

## 2015-07-12 DIAGNOSIS — I480 Paroxysmal atrial fibrillation: Secondary | ICD-10-CM | POA: Diagnosis not present

## 2015-07-12 DIAGNOSIS — E559 Vitamin D deficiency, unspecified: Secondary | ICD-10-CM | POA: Diagnosis not present

## 2015-07-12 DIAGNOSIS — Z7901 Long term (current) use of anticoagulants: Secondary | ICD-10-CM | POA: Diagnosis not present

## 2015-07-12 DIAGNOSIS — M8580 Other specified disorders of bone density and structure, unspecified site: Secondary | ICD-10-CM | POA: Diagnosis not present

## 2015-07-12 DIAGNOSIS — Z7952 Long term (current) use of systemic steroids: Secondary | ICD-10-CM | POA: Diagnosis not present

## 2015-07-12 DIAGNOSIS — R001 Bradycardia, unspecified: Secondary | ICD-10-CM | POA: Diagnosis not present

## 2015-07-12 DIAGNOSIS — I481 Persistent atrial fibrillation: Secondary | ICD-10-CM | POA: Diagnosis not present

## 2015-07-12 DIAGNOSIS — Z79899 Other long term (current) drug therapy: Secondary | ICD-10-CM | POA: Diagnosis not present

## 2015-07-12 DIAGNOSIS — N183 Chronic kidney disease, stage 3 (moderate): Secondary | ICD-10-CM | POA: Diagnosis not present

## 2015-07-12 DIAGNOSIS — I4891 Unspecified atrial fibrillation: Secondary | ICD-10-CM | POA: Diagnosis not present

## 2015-07-12 DIAGNOSIS — J449 Chronic obstructive pulmonary disease, unspecified: Secondary | ICD-10-CM | POA: Diagnosis not present

## 2015-07-12 DIAGNOSIS — D860 Sarcoidosis of lung: Secondary | ICD-10-CM | POA: Diagnosis not present

## 2015-07-12 DIAGNOSIS — I129 Hypertensive chronic kidney disease with stage 1 through stage 4 chronic kidney disease, or unspecified chronic kidney disease: Secondary | ICD-10-CM | POA: Diagnosis not present

## 2015-07-24 DIAGNOSIS — B9789 Other viral agents as the cause of diseases classified elsewhere: Secondary | ICD-10-CM | POA: Diagnosis not present

## 2015-07-24 DIAGNOSIS — I5030 Unspecified diastolic (congestive) heart failure: Secondary | ICD-10-CM | POA: Diagnosis not present

## 2015-07-24 DIAGNOSIS — D869 Sarcoidosis, unspecified: Secondary | ICD-10-CM | POA: Diagnosis not present

## 2015-07-24 DIAGNOSIS — I110 Hypertensive heart disease with heart failure: Secondary | ICD-10-CM | POA: Diagnosis not present

## 2015-07-24 DIAGNOSIS — J069 Acute upper respiratory infection, unspecified: Secondary | ICD-10-CM | POA: Diagnosis not present

## 2015-08-05 DIAGNOSIS — I482 Chronic atrial fibrillation: Secondary | ICD-10-CM | POA: Diagnosis not present

## 2015-08-05 DIAGNOSIS — J849 Interstitial pulmonary disease, unspecified: Secondary | ICD-10-CM | POA: Diagnosis not present

## 2015-08-05 DIAGNOSIS — J8410 Pulmonary fibrosis, unspecified: Secondary | ICD-10-CM | POA: Diagnosis not present

## 2015-08-12 DIAGNOSIS — I5032 Chronic diastolic (congestive) heart failure: Secondary | ICD-10-CM | POA: Diagnosis not present

## 2015-08-12 DIAGNOSIS — F418 Other specified anxiety disorders: Secondary | ICD-10-CM | POA: Diagnosis not present

## 2015-08-12 DIAGNOSIS — I480 Paroxysmal atrial fibrillation: Secondary | ICD-10-CM | POA: Diagnosis not present

## 2015-08-12 DIAGNOSIS — D869 Sarcoidosis, unspecified: Secondary | ICD-10-CM | POA: Diagnosis not present

## 2015-08-12 DIAGNOSIS — Z7901 Long term (current) use of anticoagulants: Secondary | ICD-10-CM | POA: Diagnosis not present

## 2015-08-21 DIAGNOSIS — G4761 Periodic limb movement disorder: Secondary | ICD-10-CM | POA: Diagnosis not present

## 2015-08-21 DIAGNOSIS — G4733 Obstructive sleep apnea (adult) (pediatric): Secondary | ICD-10-CM | POA: Diagnosis not present

## 2015-08-21 DIAGNOSIS — R0683 Snoring: Secondary | ICD-10-CM | POA: Diagnosis not present

## 2015-09-02 DIAGNOSIS — D869 Sarcoidosis, unspecified: Secondary | ICD-10-CM | POA: Diagnosis not present

## 2015-09-02 DIAGNOSIS — I482 Chronic atrial fibrillation: Secondary | ICD-10-CM | POA: Diagnosis not present

## 2015-09-02 DIAGNOSIS — I5032 Chronic diastolic (congestive) heart failure: Secondary | ICD-10-CM | POA: Diagnosis not present

## 2015-09-02 DIAGNOSIS — G4733 Obstructive sleep apnea (adult) (pediatric): Secondary | ICD-10-CM | POA: Diagnosis not present

## 2015-09-02 DIAGNOSIS — J849 Interstitial pulmonary disease, unspecified: Secondary | ICD-10-CM | POA: Diagnosis not present

## 2015-09-02 DIAGNOSIS — J8410 Pulmonary fibrosis, unspecified: Secondary | ICD-10-CM | POA: Diagnosis not present

## 2015-10-02 DIAGNOSIS — G4733 Obstructive sleep apnea (adult) (pediatric): Secondary | ICD-10-CM | POA: Diagnosis not present

## 2015-10-02 DIAGNOSIS — J849 Interstitial pulmonary disease, unspecified: Secondary | ICD-10-CM | POA: Diagnosis not present

## 2015-10-02 DIAGNOSIS — I129 Hypertensive chronic kidney disease with stage 1 through stage 4 chronic kidney disease, or unspecified chronic kidney disease: Secondary | ICD-10-CM | POA: Diagnosis not present

## 2015-10-02 DIAGNOSIS — I480 Paroxysmal atrial fibrillation: Secondary | ICD-10-CM | POA: Diagnosis not present

## 2015-10-02 DIAGNOSIS — D869 Sarcoidosis, unspecified: Secondary | ICD-10-CM | POA: Diagnosis not present

## 2015-10-02 DIAGNOSIS — I5181 Takotsubo syndrome: Secondary | ICD-10-CM | POA: Diagnosis not present

## 2015-12-09 DIAGNOSIS — R0602 Shortness of breath: Secondary | ICD-10-CM | POA: Diagnosis not present

## 2015-12-09 DIAGNOSIS — I5030 Unspecified diastolic (congestive) heart failure: Secondary | ICD-10-CM | POA: Diagnosis not present

## 2015-12-09 DIAGNOSIS — E7800 Pure hypercholesterolemia, unspecified: Secondary | ICD-10-CM | POA: Diagnosis not present

## 2015-12-09 DIAGNOSIS — I482 Chronic atrial fibrillation: Secondary | ICD-10-CM | POA: Diagnosis not present

## 2015-12-09 DIAGNOSIS — I110 Hypertensive heart disease with heart failure: Secondary | ICD-10-CM | POA: Diagnosis not present

## 2015-12-09 DIAGNOSIS — I5032 Chronic diastolic (congestive) heart failure: Secondary | ICD-10-CM | POA: Diagnosis not present

## 2015-12-09 DIAGNOSIS — Z79899 Other long term (current) drug therapy: Secondary | ICD-10-CM | POA: Diagnosis not present

## 2015-12-09 DIAGNOSIS — F418 Other specified anxiety disorders: Secondary | ICD-10-CM | POA: Diagnosis not present

## 2015-12-26 DIAGNOSIS — J8410 Pulmonary fibrosis, unspecified: Secondary | ICD-10-CM | POA: Diagnosis not present

## 2015-12-26 DIAGNOSIS — I482 Chronic atrial fibrillation: Secondary | ICD-10-CM | POA: Diagnosis not present

## 2015-12-26 DIAGNOSIS — D869 Sarcoidosis, unspecified: Secondary | ICD-10-CM | POA: Diagnosis not present

## 2015-12-26 DIAGNOSIS — J849 Interstitial pulmonary disease, unspecified: Secondary | ICD-10-CM | POA: Diagnosis not present

## 2015-12-26 DIAGNOSIS — G4733 Obstructive sleep apnea (adult) (pediatric): Secondary | ICD-10-CM | POA: Diagnosis not present

## 2016-01-01 DIAGNOSIS — G4733 Obstructive sleep apnea (adult) (pediatric): Secondary | ICD-10-CM | POA: Diagnosis not present

## 2016-01-08 DIAGNOSIS — J849 Interstitial pulmonary disease, unspecified: Secondary | ICD-10-CM | POA: Diagnosis not present

## 2016-01-08 DIAGNOSIS — I4891 Unspecified atrial fibrillation: Secondary | ICD-10-CM | POA: Diagnosis not present

## 2016-01-08 DIAGNOSIS — E7800 Pure hypercholesterolemia, unspecified: Secondary | ICD-10-CM | POA: Diagnosis not present

## 2016-01-08 DIAGNOSIS — I5181 Takotsubo syndrome: Secondary | ICD-10-CM | POA: Diagnosis not present

## 2016-01-08 DIAGNOSIS — I129 Hypertensive chronic kidney disease with stage 1 through stage 4 chronic kidney disease, or unspecified chronic kidney disease: Secondary | ICD-10-CM | POA: Diagnosis not present

## 2016-01-08 DIAGNOSIS — I480 Paroxysmal atrial fibrillation: Secondary | ICD-10-CM | POA: Diagnosis not present

## 2016-02-01 DIAGNOSIS — G4733 Obstructive sleep apnea (adult) (pediatric): Secondary | ICD-10-CM | POA: Diagnosis not present

## 2016-02-06 DIAGNOSIS — I110 Hypertensive heart disease with heart failure: Secondary | ICD-10-CM | POA: Diagnosis not present

## 2016-02-06 DIAGNOSIS — J8410 Pulmonary fibrosis, unspecified: Secondary | ICD-10-CM | POA: Diagnosis not present

## 2016-02-06 DIAGNOSIS — G4733 Obstructive sleep apnea (adult) (pediatric): Secondary | ICD-10-CM | POA: Diagnosis not present

## 2016-02-06 DIAGNOSIS — I5030 Unspecified diastolic (congestive) heart failure: Secondary | ICD-10-CM | POA: Diagnosis not present

## 2016-02-13 DIAGNOSIS — I10 Essential (primary) hypertension: Secondary | ICD-10-CM | POA: Diagnosis not present

## 2016-02-13 DIAGNOSIS — Z0000 Encounter for general adult medical examination without abnormal findings: Secondary | ICD-10-CM | POA: Diagnosis not present

## 2016-02-13 DIAGNOSIS — I480 Paroxysmal atrial fibrillation: Secondary | ICD-10-CM | POA: Diagnosis not present

## 2016-02-13 DIAGNOSIS — R0602 Shortness of breath: Secondary | ICD-10-CM | POA: Diagnosis not present

## 2016-02-13 DIAGNOSIS — R002 Palpitations: Secondary | ICD-10-CM | POA: Diagnosis not present

## 2016-02-13 DIAGNOSIS — Z9989 Dependence on other enabling machines and devices: Secondary | ICD-10-CM | POA: Diagnosis not present

## 2016-02-13 DIAGNOSIS — G4733 Obstructive sleep apnea (adult) (pediatric): Secondary | ICD-10-CM | POA: Diagnosis not present

## 2016-02-13 DIAGNOSIS — I5032 Chronic diastolic (congestive) heart failure: Secondary | ICD-10-CM | POA: Diagnosis not present

## 2016-03-02 DIAGNOSIS — H40003 Preglaucoma, unspecified, bilateral: Secondary | ICD-10-CM | POA: Diagnosis not present

## 2016-03-03 DIAGNOSIS — I4891 Unspecified atrial fibrillation: Secondary | ICD-10-CM | POA: Diagnosis not present

## 2016-03-03 DIAGNOSIS — G4733 Obstructive sleep apnea (adult) (pediatric): Secondary | ICD-10-CM | POA: Diagnosis not present

## 2016-03-05 DIAGNOSIS — I4891 Unspecified atrial fibrillation: Secondary | ICD-10-CM | POA: Diagnosis not present

## 2016-03-11 DIAGNOSIS — I480 Paroxysmal atrial fibrillation: Secondary | ICD-10-CM | POA: Diagnosis not present

## 2016-03-11 DIAGNOSIS — D869 Sarcoidosis, unspecified: Secondary | ICD-10-CM | POA: Diagnosis not present

## 2016-03-11 DIAGNOSIS — I129 Hypertensive chronic kidney disease with stage 1 through stage 4 chronic kidney disease, or unspecified chronic kidney disease: Secondary | ICD-10-CM | POA: Diagnosis not present

## 2016-03-11 DIAGNOSIS — I5181 Takotsubo syndrome: Secondary | ICD-10-CM | POA: Diagnosis not present

## 2016-03-13 DIAGNOSIS — I480 Paroxysmal atrial fibrillation: Secondary | ICD-10-CM | POA: Diagnosis not present

## 2016-03-30 DIAGNOSIS — J849 Interstitial pulmonary disease, unspecified: Secondary | ICD-10-CM | POA: Diagnosis not present

## 2016-03-30 DIAGNOSIS — I5181 Takotsubo syndrome: Secondary | ICD-10-CM | POA: Diagnosis not present

## 2016-03-30 DIAGNOSIS — I481 Persistent atrial fibrillation: Secondary | ICD-10-CM | POA: Diagnosis not present

## 2016-03-30 DIAGNOSIS — G4733 Obstructive sleep apnea (adult) (pediatric): Secondary | ICD-10-CM | POA: Diagnosis not present

## 2016-03-30 DIAGNOSIS — Z7901 Long term (current) use of anticoagulants: Secondary | ICD-10-CM | POA: Diagnosis not present

## 2016-04-02 DIAGNOSIS — G4733 Obstructive sleep apnea (adult) (pediatric): Secondary | ICD-10-CM | POA: Diagnosis not present

## 2016-04-09 DIAGNOSIS — N183 Chronic kidney disease, stage 3 (moderate): Secondary | ICD-10-CM | POA: Diagnosis not present

## 2016-04-09 DIAGNOSIS — I5181 Takotsubo syndrome: Secondary | ICD-10-CM | POA: Diagnosis not present

## 2016-04-09 DIAGNOSIS — G4733 Obstructive sleep apnea (adult) (pediatric): Secondary | ICD-10-CM | POA: Diagnosis not present

## 2016-04-09 DIAGNOSIS — I4891 Unspecified atrial fibrillation: Secondary | ICD-10-CM | POA: Diagnosis not present

## 2016-04-09 DIAGNOSIS — Z79899 Other long term (current) drug therapy: Secondary | ICD-10-CM | POA: Diagnosis not present

## 2016-04-09 DIAGNOSIS — Z7901 Long term (current) use of anticoagulants: Secondary | ICD-10-CM | POA: Diagnosis not present

## 2016-04-09 DIAGNOSIS — I491 Atrial premature depolarization: Secondary | ICD-10-CM | POA: Diagnosis not present

## 2016-04-09 DIAGNOSIS — J849 Interstitial pulmonary disease, unspecified: Secondary | ICD-10-CM | POA: Diagnosis not present

## 2016-04-09 DIAGNOSIS — I480 Paroxysmal atrial fibrillation: Secondary | ICD-10-CM | POA: Diagnosis not present

## 2016-04-16 DIAGNOSIS — I5030 Unspecified diastolic (congestive) heart failure: Secondary | ICD-10-CM | POA: Diagnosis not present

## 2016-04-16 DIAGNOSIS — I110 Hypertensive heart disease with heart failure: Secondary | ICD-10-CM | POA: Diagnosis not present

## 2016-04-16 DIAGNOSIS — I4510 Unspecified right bundle-branch block: Secondary | ICD-10-CM | POA: Diagnosis not present

## 2016-04-16 DIAGNOSIS — I480 Paroxysmal atrial fibrillation: Secondary | ICD-10-CM | POA: Diagnosis not present

## 2016-04-16 DIAGNOSIS — G4733 Obstructive sleep apnea (adult) (pediatric): Secondary | ICD-10-CM | POA: Diagnosis not present

## 2016-04-16 DIAGNOSIS — D869 Sarcoidosis, unspecified: Secondary | ICD-10-CM | POA: Diagnosis not present

## 2016-04-16 DIAGNOSIS — I4891 Unspecified atrial fibrillation: Secondary | ICD-10-CM | POA: Diagnosis not present

## 2016-04-16 DIAGNOSIS — Z23 Encounter for immunization: Secondary | ICD-10-CM | POA: Diagnosis not present

## 2016-04-16 DIAGNOSIS — Z9989 Dependence on other enabling machines and devices: Secondary | ICD-10-CM | POA: Diagnosis not present

## 2016-04-25 DIAGNOSIS — I129 Hypertensive chronic kidney disease with stage 1 through stage 4 chronic kidney disease, or unspecified chronic kidney disease: Secondary | ICD-10-CM | POA: Diagnosis not present

## 2016-04-25 DIAGNOSIS — N132 Hydronephrosis with renal and ureteral calculous obstruction: Secondary | ICD-10-CM | POA: Diagnosis not present

## 2016-04-25 DIAGNOSIS — N3001 Acute cystitis with hematuria: Secondary | ICD-10-CM | POA: Diagnosis not present

## 2016-04-25 DIAGNOSIS — N183 Chronic kidney disease, stage 3 (moderate): Secondary | ICD-10-CM | POA: Diagnosis not present

## 2016-04-25 DIAGNOSIS — N201 Calculus of ureter: Secondary | ICD-10-CM | POA: Diagnosis not present

## 2016-04-25 DIAGNOSIS — R1031 Right lower quadrant pain: Secondary | ICD-10-CM | POA: Diagnosis not present

## 2016-04-27 DIAGNOSIS — N201 Calculus of ureter: Secondary | ICD-10-CM | POA: Diagnosis not present

## 2016-05-03 DIAGNOSIS — G4733 Obstructive sleep apnea (adult) (pediatric): Secondary | ICD-10-CM | POA: Diagnosis not present

## 2016-05-05 DIAGNOSIS — I5181 Takotsubo syndrome: Secondary | ICD-10-CM | POA: Diagnosis not present

## 2016-05-05 DIAGNOSIS — J849 Interstitial pulmonary disease, unspecified: Secondary | ICD-10-CM | POA: Diagnosis not present

## 2016-05-05 DIAGNOSIS — I481 Persistent atrial fibrillation: Secondary | ICD-10-CM | POA: Diagnosis not present

## 2016-05-05 DIAGNOSIS — I480 Paroxysmal atrial fibrillation: Secondary | ICD-10-CM | POA: Diagnosis not present

## 2016-05-05 DIAGNOSIS — G4733 Obstructive sleep apnea (adult) (pediatric): Secondary | ICD-10-CM | POA: Diagnosis not present

## 2016-05-05 DIAGNOSIS — I129 Hypertensive chronic kidney disease with stage 1 through stage 4 chronic kidney disease, or unspecified chronic kidney disease: Secondary | ICD-10-CM | POA: Diagnosis not present

## 2016-05-06 DIAGNOSIS — N201 Calculus of ureter: Secondary | ICD-10-CM | POA: Diagnosis not present

## 2016-05-11 DIAGNOSIS — J8410 Pulmonary fibrosis, unspecified: Secondary | ICD-10-CM | POA: Diagnosis not present

## 2016-05-11 DIAGNOSIS — Z9989 Dependence on other enabling machines and devices: Secondary | ICD-10-CM | POA: Diagnosis not present

## 2016-05-11 DIAGNOSIS — I5032 Chronic diastolic (congestive) heart failure: Secondary | ICD-10-CM | POA: Diagnosis not present

## 2016-05-11 DIAGNOSIS — G4733 Obstructive sleep apnea (adult) (pediatric): Secondary | ICD-10-CM | POA: Diagnosis not present

## 2016-05-12 DIAGNOSIS — N201 Calculus of ureter: Secondary | ICD-10-CM | POA: Diagnosis not present

## 2016-05-14 DIAGNOSIS — F418 Other specified anxiety disorders: Secondary | ICD-10-CM | POA: Diagnosis not present

## 2016-05-14 DIAGNOSIS — I129 Hypertensive chronic kidney disease with stage 1 through stage 4 chronic kidney disease, or unspecified chronic kidney disease: Secondary | ICD-10-CM | POA: Diagnosis not present

## 2016-05-14 DIAGNOSIS — I5030 Unspecified diastolic (congestive) heart failure: Secondary | ICD-10-CM | POA: Diagnosis not present

## 2016-05-14 DIAGNOSIS — Z7901 Long term (current) use of anticoagulants: Secondary | ICD-10-CM | POA: Diagnosis not present

## 2016-05-14 DIAGNOSIS — I110 Hypertensive heart disease with heart failure: Secondary | ICD-10-CM | POA: Diagnosis not present

## 2016-05-26 DIAGNOSIS — I5032 Chronic diastolic (congestive) heart failure: Secondary | ICD-10-CM | POA: Diagnosis not present

## 2016-05-26 DIAGNOSIS — I129 Hypertensive chronic kidney disease with stage 1 through stage 4 chronic kidney disease, or unspecified chronic kidney disease: Secondary | ICD-10-CM | POA: Diagnosis not present

## 2016-05-26 DIAGNOSIS — Z7901 Long term (current) use of anticoagulants: Secondary | ICD-10-CM | POA: Diagnosis not present

## 2016-05-26 DIAGNOSIS — J8410 Pulmonary fibrosis, unspecified: Secondary | ICD-10-CM | POA: Diagnosis not present

## 2016-05-26 DIAGNOSIS — R918 Other nonspecific abnormal finding of lung field: Secondary | ICD-10-CM | POA: Diagnosis not present

## 2016-05-26 DIAGNOSIS — J189 Pneumonia, unspecified organism: Secondary | ICD-10-CM | POA: Diagnosis not present

## 2016-05-26 DIAGNOSIS — R05 Cough: Secondary | ICD-10-CM | POA: Diagnosis not present

## 2016-05-26 DIAGNOSIS — I482 Chronic atrial fibrillation: Secondary | ICD-10-CM | POA: Diagnosis not present

## 2016-06-02 DIAGNOSIS — G4733 Obstructive sleep apnea (adult) (pediatric): Secondary | ICD-10-CM | POA: Diagnosis not present

## 2016-06-10 DIAGNOSIS — G4733 Obstructive sleep apnea (adult) (pediatric): Secondary | ICD-10-CM | POA: Diagnosis not present

## 2016-06-10 DIAGNOSIS — I481 Persistent atrial fibrillation: Secondary | ICD-10-CM | POA: Diagnosis not present

## 2016-06-10 DIAGNOSIS — I5181 Takotsubo syndrome: Secondary | ICD-10-CM | POA: Diagnosis not present

## 2016-06-10 DIAGNOSIS — J849 Interstitial pulmonary disease, unspecified: Secondary | ICD-10-CM | POA: Diagnosis not present

## 2016-07-01 DIAGNOSIS — I4891 Unspecified atrial fibrillation: Secondary | ICD-10-CM | POA: Diagnosis not present

## 2016-07-03 DIAGNOSIS — G4733 Obstructive sleep apnea (adult) (pediatric): Secondary | ICD-10-CM | POA: Diagnosis not present

## 2016-07-16 DIAGNOSIS — G4733 Obstructive sleep apnea (adult) (pediatric): Secondary | ICD-10-CM | POA: Diagnosis not present

## 2016-07-16 DIAGNOSIS — I5030 Unspecified diastolic (congestive) heart failure: Secondary | ICD-10-CM | POA: Diagnosis not present

## 2016-07-16 DIAGNOSIS — R61 Generalized hyperhidrosis: Secondary | ICD-10-CM | POA: Diagnosis not present

## 2016-07-16 DIAGNOSIS — I110 Hypertensive heart disease with heart failure: Secondary | ICD-10-CM | POA: Diagnosis not present

## 2016-07-16 DIAGNOSIS — J849 Interstitial pulmonary disease, unspecified: Secondary | ICD-10-CM | POA: Diagnosis not present

## 2016-07-16 DIAGNOSIS — Z9989 Dependence on other enabling machines and devices: Secondary | ICD-10-CM | POA: Diagnosis not present

## 2016-07-16 DIAGNOSIS — I482 Chronic atrial fibrillation: Secondary | ICD-10-CM | POA: Diagnosis not present

## 2016-07-16 DIAGNOSIS — Z7901 Long term (current) use of anticoagulants: Secondary | ICD-10-CM | POA: Diagnosis not present

## 2016-07-24 DIAGNOSIS — Z9889 Other specified postprocedural states: Secondary | ICD-10-CM | POA: Diagnosis not present

## 2016-07-24 DIAGNOSIS — G4733 Obstructive sleep apnea (adult) (pediatric): Secondary | ICD-10-CM | POA: Diagnosis not present

## 2016-07-24 DIAGNOSIS — Z90710 Acquired absence of both cervix and uterus: Secondary | ICD-10-CM | POA: Diagnosis not present

## 2016-07-24 DIAGNOSIS — I2510 Atherosclerotic heart disease of native coronary artery without angina pectoris: Secondary | ICD-10-CM | POA: Diagnosis not present

## 2016-07-24 DIAGNOSIS — I5181 Takotsubo syndrome: Secondary | ICD-10-CM | POA: Diagnosis not present

## 2016-07-24 DIAGNOSIS — E559 Vitamin D deficiency, unspecified: Secondary | ICD-10-CM | POA: Diagnosis not present

## 2016-07-24 DIAGNOSIS — Z79899 Other long term (current) drug therapy: Secondary | ICD-10-CM | POA: Diagnosis not present

## 2016-07-24 DIAGNOSIS — J849 Interstitial pulmonary disease, unspecified: Secondary | ICD-10-CM | POA: Diagnosis not present

## 2016-07-24 DIAGNOSIS — I5032 Chronic diastolic (congestive) heart failure: Secondary | ICD-10-CM | POA: Diagnosis not present

## 2016-07-24 DIAGNOSIS — I4891 Unspecified atrial fibrillation: Secondary | ICD-10-CM | POA: Diagnosis not present

## 2016-07-24 DIAGNOSIS — N183 Chronic kidney disease, stage 3 (moderate): Secondary | ICD-10-CM | POA: Diagnosis not present

## 2016-07-24 DIAGNOSIS — Z7901 Long term (current) use of anticoagulants: Secondary | ICD-10-CM | POA: Diagnosis not present

## 2016-08-03 DIAGNOSIS — G4733 Obstructive sleep apnea (adult) (pediatric): Secondary | ICD-10-CM | POA: Diagnosis not present

## 2016-08-31 DIAGNOSIS — G4733 Obstructive sleep apnea (adult) (pediatric): Secondary | ICD-10-CM | POA: Diagnosis not present

## 2016-09-09 DIAGNOSIS — J849 Interstitial pulmonary disease, unspecified: Secondary | ICD-10-CM | POA: Diagnosis not present

## 2016-09-09 DIAGNOSIS — G4733 Obstructive sleep apnea (adult) (pediatric): Secondary | ICD-10-CM | POA: Diagnosis not present

## 2016-09-09 DIAGNOSIS — I5181 Takotsubo syndrome: Secondary | ICD-10-CM | POA: Diagnosis not present

## 2016-09-09 DIAGNOSIS — D869 Sarcoidosis, unspecified: Secondary | ICD-10-CM | POA: Diagnosis not present

## 2016-09-09 DIAGNOSIS — E7800 Pure hypercholesterolemia, unspecified: Secondary | ICD-10-CM | POA: Diagnosis not present

## 2016-09-09 DIAGNOSIS — I480 Paroxysmal atrial fibrillation: Secondary | ICD-10-CM | POA: Diagnosis not present

## 2016-09-11 DIAGNOSIS — I110 Hypertensive heart disease with heart failure: Secondary | ICD-10-CM | POA: Diagnosis not present

## 2016-09-11 DIAGNOSIS — D869 Sarcoidosis, unspecified: Secondary | ICD-10-CM | POA: Diagnosis not present

## 2016-09-11 DIAGNOSIS — I5032 Chronic diastolic (congestive) heart failure: Secondary | ICD-10-CM | POA: Diagnosis not present

## 2016-09-11 DIAGNOSIS — F418 Other specified anxiety disorders: Secondary | ICD-10-CM | POA: Diagnosis not present

## 2016-09-11 DIAGNOSIS — Z636 Dependent relative needing care at home: Secondary | ICD-10-CM | POA: Diagnosis not present

## 2016-09-11 DIAGNOSIS — Z9989 Dependence on other enabling machines and devices: Secondary | ICD-10-CM | POA: Diagnosis not present

## 2016-09-11 DIAGNOSIS — I5030 Unspecified diastolic (congestive) heart failure: Secondary | ICD-10-CM | POA: Diagnosis not present

## 2016-09-11 DIAGNOSIS — I482 Chronic atrial fibrillation: Secondary | ICD-10-CM | POA: Diagnosis not present

## 2016-09-11 DIAGNOSIS — G4733 Obstructive sleep apnea (adult) (pediatric): Secondary | ICD-10-CM | POA: Diagnosis not present

## 2016-09-23 DIAGNOSIS — I480 Paroxysmal atrial fibrillation: Secondary | ICD-10-CM | POA: Diagnosis not present

## 2016-09-23 DIAGNOSIS — G4733 Obstructive sleep apnea (adult) (pediatric): Secondary | ICD-10-CM | POA: Diagnosis not present

## 2016-09-25 DIAGNOSIS — I480 Paroxysmal atrial fibrillation: Secondary | ICD-10-CM | POA: Diagnosis not present

## 2016-09-28 DIAGNOSIS — H40003 Preglaucoma, unspecified, bilateral: Secondary | ICD-10-CM | POA: Diagnosis not present

## 2016-10-01 DIAGNOSIS — G4733 Obstructive sleep apnea (adult) (pediatric): Secondary | ICD-10-CM | POA: Diagnosis not present

## 2016-10-02 DIAGNOSIS — R0602 Shortness of breath: Secondary | ICD-10-CM | POA: Diagnosis not present

## 2016-10-02 DIAGNOSIS — G4733 Obstructive sleep apnea (adult) (pediatric): Secondary | ICD-10-CM | POA: Diagnosis not present

## 2016-10-02 DIAGNOSIS — Z9989 Dependence on other enabling machines and devices: Secondary | ICD-10-CM | POA: Diagnosis not present

## 2016-10-02 DIAGNOSIS — I5030 Unspecified diastolic (congestive) heart failure: Secondary | ICD-10-CM | POA: Diagnosis not present

## 2016-10-02 DIAGNOSIS — I482 Chronic atrial fibrillation: Secondary | ICD-10-CM | POA: Diagnosis not present

## 2016-10-02 DIAGNOSIS — I110 Hypertensive heart disease with heart failure: Secondary | ICD-10-CM | POA: Diagnosis not present

## 2016-10-02 DIAGNOSIS — J301 Allergic rhinitis due to pollen: Secondary | ICD-10-CM | POA: Diagnosis not present

## 2016-10-07 DIAGNOSIS — I129 Hypertensive chronic kidney disease with stage 1 through stage 4 chronic kidney disease, or unspecified chronic kidney disease: Secondary | ICD-10-CM | POA: Diagnosis not present

## 2016-10-07 DIAGNOSIS — R0602 Shortness of breath: Secondary | ICD-10-CM | POA: Diagnosis not present

## 2016-10-07 DIAGNOSIS — I5181 Takotsubo syndrome: Secondary | ICD-10-CM | POA: Diagnosis not present

## 2016-10-07 DIAGNOSIS — I480 Paroxysmal atrial fibrillation: Secondary | ICD-10-CM | POA: Diagnosis not present

## 2016-10-07 DIAGNOSIS — G4733 Obstructive sleep apnea (adult) (pediatric): Secondary | ICD-10-CM | POA: Diagnosis not present

## 2016-10-07 DIAGNOSIS — J849 Interstitial pulmonary disease, unspecified: Secondary | ICD-10-CM | POA: Diagnosis not present

## 2016-10-12 DIAGNOSIS — I5030 Unspecified diastolic (congestive) heart failure: Secondary | ICD-10-CM | POA: Diagnosis not present

## 2016-10-12 DIAGNOSIS — J849 Interstitial pulmonary disease, unspecified: Secondary | ICD-10-CM | POA: Diagnosis not present

## 2016-10-12 DIAGNOSIS — I110 Hypertensive heart disease with heart failure: Secondary | ICD-10-CM | POA: Diagnosis not present

## 2016-10-12 DIAGNOSIS — R0602 Shortness of breath: Secondary | ICD-10-CM | POA: Diagnosis not present

## 2016-10-12 DIAGNOSIS — R05 Cough: Secondary | ICD-10-CM | POA: Diagnosis not present

## 2016-10-12 DIAGNOSIS — I481 Persistent atrial fibrillation: Secondary | ICD-10-CM | POA: Diagnosis not present

## 2016-10-12 DIAGNOSIS — J301 Allergic rhinitis due to pollen: Secondary | ICD-10-CM | POA: Diagnosis not present

## 2016-10-23 DIAGNOSIS — Z9989 Dependence on other enabling machines and devices: Secondary | ICD-10-CM | POA: Diagnosis not present

## 2016-10-23 DIAGNOSIS — G4733 Obstructive sleep apnea (adult) (pediatric): Secondary | ICD-10-CM | POA: Diagnosis not present

## 2016-10-23 DIAGNOSIS — J8410 Pulmonary fibrosis, unspecified: Secondary | ICD-10-CM | POA: Diagnosis not present

## 2016-10-23 DIAGNOSIS — I481 Persistent atrial fibrillation: Secondary | ICD-10-CM | POA: Diagnosis not present

## 2016-10-23 DIAGNOSIS — I5032 Chronic diastolic (congestive) heart failure: Secondary | ICD-10-CM | POA: Diagnosis not present

## 2016-10-27 DIAGNOSIS — I4891 Unspecified atrial fibrillation: Secondary | ICD-10-CM | POA: Diagnosis not present

## 2016-10-31 DIAGNOSIS — G4733 Obstructive sleep apnea (adult) (pediatric): Secondary | ICD-10-CM | POA: Diagnosis not present

## 2016-11-03 DIAGNOSIS — I110 Hypertensive heart disease with heart failure: Secondary | ICD-10-CM | POA: Diagnosis not present

## 2016-11-03 DIAGNOSIS — I5030 Unspecified diastolic (congestive) heart failure: Secondary | ICD-10-CM | POA: Diagnosis not present

## 2016-11-03 DIAGNOSIS — Z23 Encounter for immunization: Secondary | ICD-10-CM | POA: Diagnosis not present

## 2016-11-03 DIAGNOSIS — J8410 Pulmonary fibrosis, unspecified: Secondary | ICD-10-CM | POA: Diagnosis not present

## 2016-11-24 DIAGNOSIS — I481 Persistent atrial fibrillation: Secondary | ICD-10-CM | POA: Diagnosis not present

## 2016-11-24 DIAGNOSIS — I5181 Takotsubo syndrome: Secondary | ICD-10-CM | POA: Diagnosis not present

## 2016-12-01 DIAGNOSIS — G4733 Obstructive sleep apnea (adult) (pediatric): Secondary | ICD-10-CM | POA: Diagnosis not present

## 2016-12-31 DIAGNOSIS — G4733 Obstructive sleep apnea (adult) (pediatric): Secondary | ICD-10-CM | POA: Diagnosis not present

## 2017-01-08 IMAGING — CR DG CHEST 2V
2 series · 2 of 2 positions shown · non-contrast
Comparison: Previous examinations including the CT dated 12/25/2014
and chest radiographs dated 12/31/2014. 12/31/2014.

CLINICAL DATA: Dyspnea on exertion.

EXAM:
CHEST  2 VIEW

[view not recorded (1 of 2)]
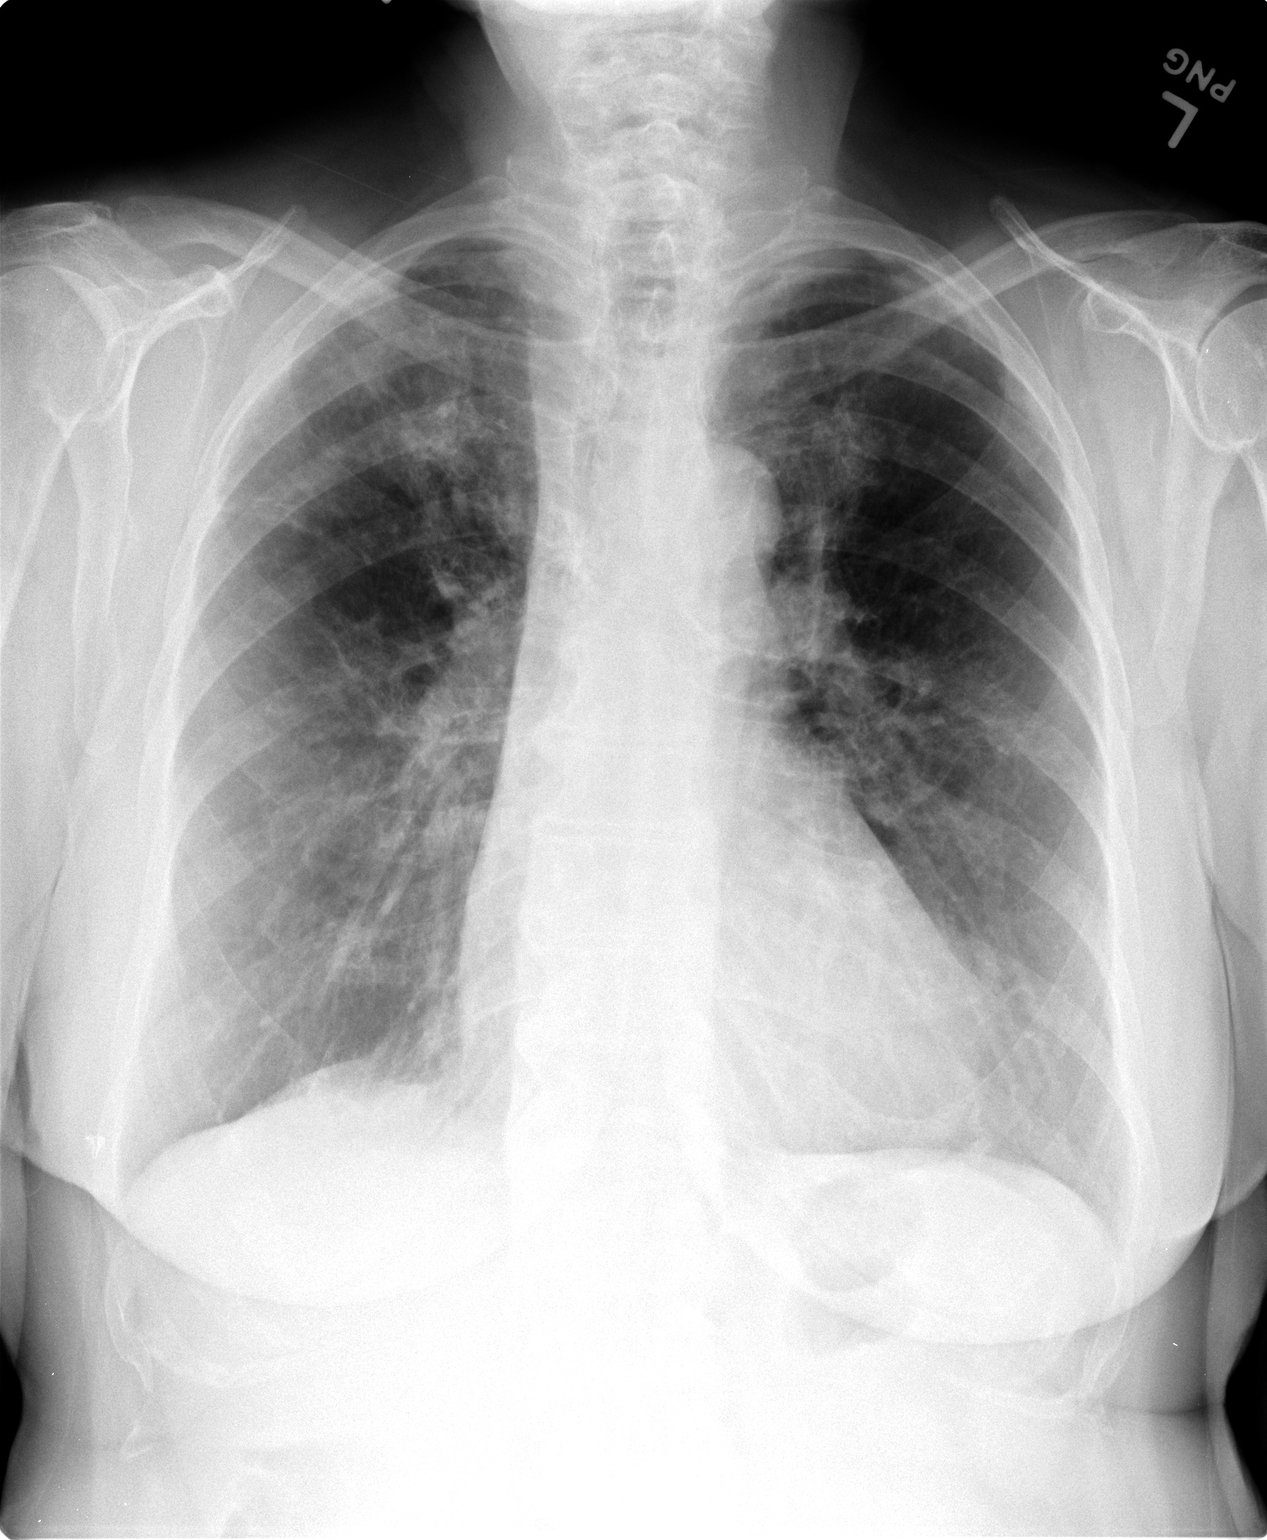

[view not recorded (2 of 2)]
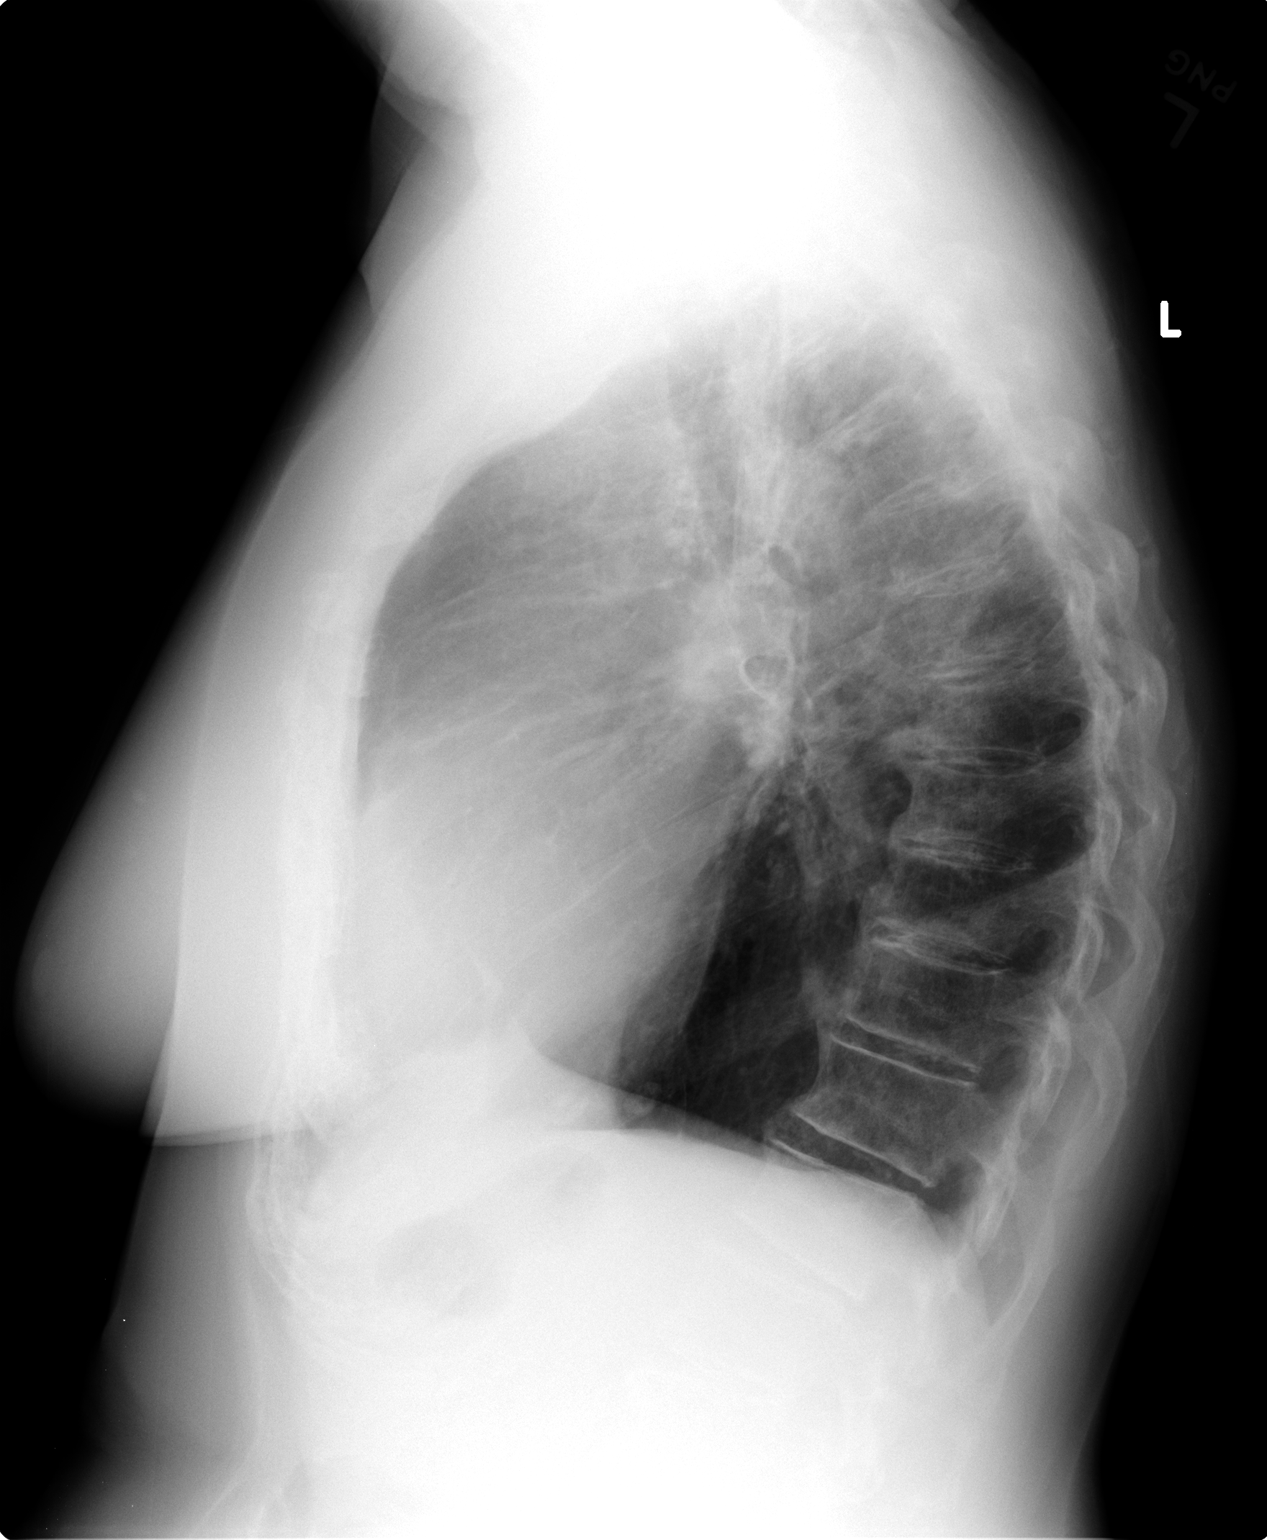

[2 of 2 positions shown; findings below may reference images not displayed]

FINDINGS: The cardiac silhouette remains borderline enlarged. No significant
change in patchy density in both upper lobes. The lungs remain
hyperexpanded. Thoracic spine degenerative changes, including
changes of DISH.
IMPRESSION: Stable patchy density in both upper lobes felt represent systemic
disease such as sarcoidosis or, less likely, unusual manifestation
of chronic hypersensitivity pneumonitis or nonspecific interstitial
pneumonia on the previous CT.

## 2017-01-11 DIAGNOSIS — I480 Paroxysmal atrial fibrillation: Secondary | ICD-10-CM | POA: Diagnosis not present

## 2017-01-28 DIAGNOSIS — I5032 Chronic diastolic (congestive) heart failure: Secondary | ICD-10-CM | POA: Diagnosis not present

## 2017-01-28 DIAGNOSIS — E559 Vitamin D deficiency, unspecified: Secondary | ICD-10-CM | POA: Diagnosis not present

## 2017-01-28 DIAGNOSIS — I4891 Unspecified atrial fibrillation: Secondary | ICD-10-CM | POA: Diagnosis not present

## 2017-01-28 DIAGNOSIS — N183 Chronic kidney disease, stage 3 (moderate): Secondary | ICD-10-CM | POA: Diagnosis not present

## 2017-01-28 DIAGNOSIS — I4892 Unspecified atrial flutter: Secondary | ICD-10-CM | POA: Diagnosis not present

## 2017-01-28 DIAGNOSIS — I5181 Takotsubo syndrome: Secondary | ICD-10-CM | POA: Diagnosis not present

## 2017-01-28 DIAGNOSIS — Z9889 Other specified postprocedural states: Secondary | ICD-10-CM | POA: Diagnosis not present

## 2017-01-28 DIAGNOSIS — Z90710 Acquired absence of both cervix and uterus: Secondary | ICD-10-CM | POA: Diagnosis not present

## 2017-01-28 DIAGNOSIS — J8410 Pulmonary fibrosis, unspecified: Secondary | ICD-10-CM | POA: Diagnosis not present

## 2017-01-28 DIAGNOSIS — Z79899 Other long term (current) drug therapy: Secondary | ICD-10-CM | POA: Diagnosis not present

## 2017-01-28 DIAGNOSIS — G4733 Obstructive sleep apnea (adult) (pediatric): Secondary | ICD-10-CM | POA: Diagnosis not present

## 2017-01-28 DIAGNOSIS — Z7901 Long term (current) use of anticoagulants: Secondary | ICD-10-CM | POA: Diagnosis not present

## 2017-02-02 DIAGNOSIS — I481 Persistent atrial fibrillation: Secondary | ICD-10-CM | POA: Diagnosis not present

## 2017-02-02 DIAGNOSIS — Z7901 Long term (current) use of anticoagulants: Secondary | ICD-10-CM | POA: Diagnosis not present

## 2017-02-02 DIAGNOSIS — Z23 Encounter for immunization: Secondary | ICD-10-CM | POA: Diagnosis not present

## 2017-02-02 DIAGNOSIS — J8410 Pulmonary fibrosis, unspecified: Secondary | ICD-10-CM | POA: Diagnosis not present

## 2017-02-02 DIAGNOSIS — I110 Hypertensive heart disease with heart failure: Secondary | ICD-10-CM | POA: Diagnosis not present

## 2017-02-07 DIAGNOSIS — I4891 Unspecified atrial fibrillation: Secondary | ICD-10-CM | POA: Diagnosis not present

## 2017-02-07 DIAGNOSIS — I452 Bifascicular block: Secondary | ICD-10-CM | POA: Diagnosis not present

## 2017-02-22 DIAGNOSIS — I10 Essential (primary) hypertension: Secondary | ICD-10-CM | POA: Diagnosis not present

## 2017-02-22 DIAGNOSIS — I4510 Unspecified right bundle-branch block: Secondary | ICD-10-CM | POA: Diagnosis not present

## 2017-02-22 DIAGNOSIS — I4891 Unspecified atrial fibrillation: Secondary | ICD-10-CM | POA: Diagnosis not present

## 2017-02-22 DIAGNOSIS — J849 Interstitial pulmonary disease, unspecified: Secondary | ICD-10-CM | POA: Diagnosis not present

## 2017-02-22 DIAGNOSIS — Z7901 Long term (current) use of anticoagulants: Secondary | ICD-10-CM | POA: Diagnosis not present

## 2017-02-23 DIAGNOSIS — I4510 Unspecified right bundle-branch block: Secondary | ICD-10-CM | POA: Diagnosis not present

## 2017-02-23 DIAGNOSIS — I4891 Unspecified atrial fibrillation: Secondary | ICD-10-CM | POA: Diagnosis not present

## 2017-02-26 DIAGNOSIS — Z0000 Encounter for general adult medical examination without abnormal findings: Secondary | ICD-10-CM | POA: Diagnosis not present

## 2017-03-09 DIAGNOSIS — G4733 Obstructive sleep apnea (adult) (pediatric): Secondary | ICD-10-CM | POA: Diagnosis not present

## 2017-03-15 DIAGNOSIS — I480 Paroxysmal atrial fibrillation: Secondary | ICD-10-CM | POA: Diagnosis not present

## 2017-03-17 DIAGNOSIS — G4733 Obstructive sleep apnea (adult) (pediatric): Secondary | ICD-10-CM | POA: Diagnosis not present

## 2017-03-18 DIAGNOSIS — G4733 Obstructive sleep apnea (adult) (pediatric): Secondary | ICD-10-CM | POA: Diagnosis not present

## 2017-03-26 DIAGNOSIS — D869 Sarcoidosis, unspecified: Secondary | ICD-10-CM | POA: Diagnosis not present

## 2017-03-26 DIAGNOSIS — G4733 Obstructive sleep apnea (adult) (pediatric): Secondary | ICD-10-CM | POA: Diagnosis not present

## 2017-03-26 DIAGNOSIS — R0602 Shortness of breath: Secondary | ICD-10-CM | POA: Diagnosis not present

## 2017-03-26 DIAGNOSIS — J849 Interstitial pulmonary disease, unspecified: Secondary | ICD-10-CM | POA: Diagnosis not present

## 2017-03-29 DIAGNOSIS — H40003 Preglaucoma, unspecified, bilateral: Secondary | ICD-10-CM | POA: Diagnosis not present

## 2017-05-04 DIAGNOSIS — J849 Interstitial pulmonary disease, unspecified: Secondary | ICD-10-CM | POA: Diagnosis not present

## 2017-05-04 DIAGNOSIS — I481 Persistent atrial fibrillation: Secondary | ICD-10-CM | POA: Diagnosis not present

## 2017-05-04 DIAGNOSIS — I4510 Unspecified right bundle-branch block: Secondary | ICD-10-CM | POA: Diagnosis not present

## 2017-05-04 DIAGNOSIS — I110 Hypertensive heart disease with heart failure: Secondary | ICD-10-CM | POA: Diagnosis not present

## 2017-05-04 DIAGNOSIS — I4891 Unspecified atrial fibrillation: Secondary | ICD-10-CM | POA: Diagnosis not present

## 2017-05-04 DIAGNOSIS — I5181 Takotsubo syndrome: Secondary | ICD-10-CM | POA: Diagnosis not present

## 2017-05-05 DIAGNOSIS — D869 Sarcoidosis, unspecified: Secondary | ICD-10-CM | POA: Diagnosis not present

## 2017-05-05 DIAGNOSIS — E7800 Pure hypercholesterolemia, unspecified: Secondary | ICD-10-CM | POA: Diagnosis not present

## 2017-05-05 DIAGNOSIS — Z7901 Long term (current) use of anticoagulants: Secondary | ICD-10-CM | POA: Diagnosis not present

## 2017-05-05 DIAGNOSIS — N183 Chronic kidney disease, stage 3 (moderate): Secondary | ICD-10-CM | POA: Diagnosis not present

## 2017-05-05 DIAGNOSIS — I130 Hypertensive heart and chronic kidney disease with heart failure and stage 1 through stage 4 chronic kidney disease, or unspecified chronic kidney disease: Secondary | ICD-10-CM | POA: Diagnosis not present

## 2017-07-07 DIAGNOSIS — R6889 Other general symptoms and signs: Secondary | ICD-10-CM | POA: Diagnosis not present

## 2017-07-07 DIAGNOSIS — I481 Persistent atrial fibrillation: Secondary | ICD-10-CM | POA: Diagnosis not present

## 2017-07-07 DIAGNOSIS — J8410 Pulmonary fibrosis, unspecified: Secondary | ICD-10-CM | POA: Diagnosis not present

## 2017-07-07 DIAGNOSIS — Z20828 Contact with and (suspected) exposure to other viral communicable diseases: Secondary | ICD-10-CM | POA: Diagnosis not present

## 2017-07-09 DIAGNOSIS — E871 Hypo-osmolality and hyponatremia: Secondary | ICD-10-CM | POA: Diagnosis not present

## 2017-07-09 DIAGNOSIS — M1990 Unspecified osteoarthritis, unspecified site: Secondary | ICD-10-CM | POA: Diagnosis not present

## 2017-07-09 DIAGNOSIS — Z7902 Long term (current) use of antithrombotics/antiplatelets: Secondary | ICD-10-CM | POA: Diagnosis not present

## 2017-07-09 DIAGNOSIS — R918 Other nonspecific abnormal finding of lung field: Secondary | ICD-10-CM | POA: Diagnosis not present

## 2017-07-09 DIAGNOSIS — Z79899 Other long term (current) drug therapy: Secondary | ICD-10-CM | POA: Diagnosis not present

## 2017-07-09 DIAGNOSIS — E860 Dehydration: Secondary | ICD-10-CM | POA: Diagnosis not present

## 2017-07-09 DIAGNOSIS — I130 Hypertensive heart and chronic kidney disease with heart failure and stage 1 through stage 4 chronic kidney disease, or unspecified chronic kidney disease: Secondary | ICD-10-CM | POA: Diagnosis not present

## 2017-07-09 DIAGNOSIS — Z888 Allergy status to other drugs, medicaments and biological substances status: Secondary | ICD-10-CM | POA: Diagnosis not present

## 2017-07-09 DIAGNOSIS — N183 Chronic kidney disease, stage 3 (moderate): Secondary | ICD-10-CM | POA: Diagnosis not present

## 2017-07-09 DIAGNOSIS — J1100 Influenza due to unidentified influenza virus with unspecified type of pneumonia: Secondary | ICD-10-CM | POA: Diagnosis not present

## 2017-07-09 DIAGNOSIS — J9601 Acute respiratory failure with hypoxia: Secondary | ICD-10-CM | POA: Diagnosis not present

## 2017-07-09 DIAGNOSIS — R0602 Shortness of breath: Secondary | ICD-10-CM | POA: Diagnosis not present

## 2017-07-09 DIAGNOSIS — I4891 Unspecified atrial fibrillation: Secondary | ICD-10-CM | POA: Diagnosis not present

## 2017-07-09 DIAGNOSIS — J8410 Pulmonary fibrosis, unspecified: Secondary | ICD-10-CM | POA: Diagnosis not present

## 2017-07-09 DIAGNOSIS — R05 Cough: Secondary | ICD-10-CM | POA: Diagnosis not present

## 2017-07-09 DIAGNOSIS — J111 Influenza due to unidentified influenza virus with other respiratory manifestations: Secondary | ICD-10-CM | POA: Diagnosis not present

## 2017-07-09 DIAGNOSIS — E7800 Pure hypercholesterolemia, unspecified: Secondary | ICD-10-CM | POA: Diagnosis not present

## 2017-07-09 DIAGNOSIS — I480 Paroxysmal atrial fibrillation: Secondary | ICD-10-CM | POA: Diagnosis not present

## 2017-07-09 DIAGNOSIS — I5032 Chronic diastolic (congestive) heart failure: Secondary | ICD-10-CM | POA: Diagnosis not present

## 2017-07-09 DIAGNOSIS — J441 Chronic obstructive pulmonary disease with (acute) exacerbation: Secondary | ICD-10-CM | POA: Diagnosis not present

## 2017-07-09 DIAGNOSIS — J129 Viral pneumonia, unspecified: Secondary | ICD-10-CM | POA: Diagnosis not present

## 2017-07-09 DIAGNOSIS — I499 Cardiac arrhythmia, unspecified: Secondary | ICD-10-CM | POA: Diagnosis not present

## 2017-07-14 DIAGNOSIS — R0602 Shortness of breath: Secondary | ICD-10-CM | POA: Diagnosis not present

## 2017-07-14 DIAGNOSIS — J441 Chronic obstructive pulmonary disease with (acute) exacerbation: Secondary | ICD-10-CM | POA: Diagnosis not present

## 2017-07-14 DIAGNOSIS — J9601 Acute respiratory failure with hypoxia: Secondary | ICD-10-CM | POA: Diagnosis not present

## 2017-07-14 DIAGNOSIS — I130 Hypertensive heart and chronic kidney disease with heart failure and stage 1 through stage 4 chronic kidney disease, or unspecified chronic kidney disease: Secondary | ICD-10-CM | POA: Diagnosis not present

## 2017-07-14 DIAGNOSIS — I4891 Unspecified atrial fibrillation: Secondary | ICD-10-CM | POA: Diagnosis not present

## 2017-07-14 DIAGNOSIS — J1100 Influenza due to unidentified influenza virus with unspecified type of pneumonia: Secondary | ICD-10-CM | POA: Diagnosis not present

## 2017-07-14 DIAGNOSIS — I5032 Chronic diastolic (congestive) heart failure: Secondary | ICD-10-CM | POA: Diagnosis not present

## 2017-07-14 DIAGNOSIS — E871 Hypo-osmolality and hyponatremia: Secondary | ICD-10-CM | POA: Diagnosis not present

## 2017-07-16 DIAGNOSIS — J440 Chronic obstructive pulmonary disease with acute lower respiratory infection: Secondary | ICD-10-CM | POA: Diagnosis not present

## 2017-07-16 DIAGNOSIS — I480 Paroxysmal atrial fibrillation: Secondary | ICD-10-CM | POA: Diagnosis not present

## 2017-07-16 DIAGNOSIS — J1100 Influenza due to unidentified influenza virus with unspecified type of pneumonia: Secondary | ICD-10-CM | POA: Diagnosis not present

## 2017-07-16 DIAGNOSIS — J8410 Pulmonary fibrosis, unspecified: Secondary | ICD-10-CM | POA: Diagnosis not present

## 2017-07-16 DIAGNOSIS — Z7951 Long term (current) use of inhaled steroids: Secondary | ICD-10-CM | POA: Diagnosis not present

## 2017-07-16 DIAGNOSIS — I130 Hypertensive heart and chronic kidney disease with heart failure and stage 1 through stage 4 chronic kidney disease, or unspecified chronic kidney disease: Secondary | ICD-10-CM | POA: Diagnosis not present

## 2017-07-16 DIAGNOSIS — N183 Chronic kidney disease, stage 3 (moderate): Secondary | ICD-10-CM | POA: Diagnosis not present

## 2017-07-16 DIAGNOSIS — Z9981 Dependence on supplemental oxygen: Secondary | ICD-10-CM | POA: Diagnosis not present

## 2017-07-16 DIAGNOSIS — M19041 Primary osteoarthritis, right hand: Secondary | ICD-10-CM | POA: Diagnosis not present

## 2017-07-16 DIAGNOSIS — Z7902 Long term (current) use of antithrombotics/antiplatelets: Secondary | ICD-10-CM | POA: Diagnosis not present

## 2017-07-16 DIAGNOSIS — J441 Chronic obstructive pulmonary disease with (acute) exacerbation: Secondary | ICD-10-CM | POA: Diagnosis not present

## 2017-07-16 DIAGNOSIS — M19042 Primary osteoarthritis, left hand: Secondary | ICD-10-CM | POA: Diagnosis not present

## 2017-07-16 DIAGNOSIS — I5032 Chronic diastolic (congestive) heart failure: Secondary | ICD-10-CM | POA: Diagnosis not present

## 2017-07-20 ENCOUNTER — Other Ambulatory Visit: Payer: Self-pay | Admitting: *Deleted

## 2017-07-20 DIAGNOSIS — I110 Hypertensive heart disease with heart failure: Secondary | ICD-10-CM | POA: Diagnosis not present

## 2017-07-20 DIAGNOSIS — I481 Persistent atrial fibrillation: Secondary | ICD-10-CM | POA: Diagnosis not present

## 2017-07-20 DIAGNOSIS — J8410 Pulmonary fibrosis, unspecified: Secondary | ICD-10-CM | POA: Diagnosis not present

## 2017-07-20 DIAGNOSIS — G4733 Obstructive sleep apnea (adult) (pediatric): Secondary | ICD-10-CM | POA: Diagnosis not present

## 2017-07-21 NOTE — Patient Outreach (Signed)
Rebecca Robinson) Care Management  07/20/17  [place] [birth year] [id number]   Transition of Care Referral  Referral Date: 07/19/17 Referral Source: HTA [facility] Date of Discharge: 07/15/17 Facility: [facility] Discharge Diagnosis: Flu, Pneumonia, COPD exacerbation Insurance: HTA  Outreach attempt # 1 to patient. HIPAA identifiers verified with patient. Patient requested for RN CM to speak with her daughter Rebecca Robinson). [name] confirmed patient was recently in the hospital. She stated, patient's heart rate and blood pressure were elevated, which caused her to be hospitalized. [name] reported, patient was diagnosed with Atrial Fibrillation during her hospitalization. Patient was prescribed Cardizem 300 mg daily, which has controlled her heart rate. Patient was discharged home on continuous Oxygen. She uses a CPAP at night without Oxygen and she tolerates it well, per Rebecca Robinson. Patient monitors her BP daily and her last reading was 96/60 and 100/60. Patient has a scheduled hospital follow-up appointment with her primary MD today. [name] plans to talk with the MD about patient's B/P being low. Patient monitors her weight daily due to having a history of CHF. Patient's last weight was 150 pounds and she lost 5 pounds in the hospital, per [name]. Per Rebecca Robinson, patient takes her medications as prescribed. She "falls in the donut hole every year". Rebecca Robinson is requesting assistance, if possible with affording meds. [facility] services and benefits explained to [facility] and patient. Patient agreed to services. RN CM and [name] discussed arranging appointments for patient with [name] (Pulmonologist) and [name] (Cardiologist). Patient has a scheduled appointment with [name] on 09/08/17 and patient wants to reschedule for an earlier date.  [name] discussed how patient is improving and she continues to experience some minor weakness. Patient sits up in the chair and goes to the bathroom. She  still needs assistance with her ADLs. Patient is active with Rebecca Robinson (PT/RN).     Plan: RN CM advised patient to contact RNCM for any needs or concerns. RN CM advised patient to alert MD for any changes in conditions.  RN CM will send [name] pharmacy referral for affording medications. RN CM will send patient EMMI COPD, Atrial Fibrillation, and respiratory materials. RM CM will send successful outreach letter to patient with introductory [facility] welcome package. RN CM will send primary MD barriers letter.  Rebecca Bells, RN, BSN, MHA/MSL, [place] Telephonic Care Manager Coordinator [facility] Direct Phone: [phone] Toll Free: [phone] Fax: [phone]

## 2017-07-22 ENCOUNTER — Encounter: Payer: Self-pay | Admitting: *Deleted

## 2017-07-23 ENCOUNTER — Other Ambulatory Visit: Payer: Self-pay | Admitting: *Deleted

## 2017-07-23 ENCOUNTER — Encounter: Payer: Self-pay | Admitting: *Deleted

## 2017-07-23 NOTE — Patient Outreach (Signed)
[facility]) Care Management  07/23/2017  [place] 1931/10/18 [id number]   Outgoing telephone call to Dr. Cruz Condon office to arrange a follow-up hospital discharge appointment for patient. Patient's appointment was scheduled for 07/29/17.   Outgoing telephone call to Dr. Blane Ohara office to arrange a follow-up hospital discharge appointment for patient. Patient's appointment was scheduled for 07/27/16.  Plan: RN CM will contact patient to provide patient with appointment times.  [place] Bells, RN, BSN, MHA/MSL, [place] Telephonic Care Manager Coordinator [facility] Direct Phone: [phone] Cell Phone: [phone] Toll Free: [phone] Fax: [phone]

## 2017-07-23 NOTE — Patient Outreach (Signed)
[facility]) Care Management  07/23/2017  [place] Jan 17, 1932 [id number]   Telephone Assessment:  Outgoing telephone call to patient. HIPAA verified with patient's daughter. Patient gave permission to speak with her daughter during previous telephone conversation. Rebecca Robinson stated, patient was resting. RN CM provided Rebecca Robinson with follow-up discharge hospital appointments arranged with [name] (Pulmonologist) on 07/29/17 and the Nurse Practitioner (NP) at the Cardiologist office on 07/27/17. Rebecca Robinson stated, patient wants to visit with the doctor instead of the NP. RN CM informed Rebecca Robinson that she will cancel the appointment on 07/27/17 and reschedule the appointment on 09/08/17. Rebecca Robinson stated, they were encouraged to contact the Cardiology office weekly to check for opened appointments and they will be placed on a waiting list. Per EMR, Diltiazem was decreased to 180 mg during patient's MD visit. Rebecca Robinson plans to clarify dosage with patient. Rebecca Robinson reported, patient has been having difficulty with Spiriva. She stated, the patient has developed a "sore on her bottom lip and a fever blister". Per Rebecca Robinson, this was discussed with Dr. Selena Batten and no new orders were prescribed. Rebecca Robinson stated, PT with [facility]) suggested Probiotics may help relieve patient's symptoms. Rebecca Robinson reported, patient rinses her mouth with water. Rebecca Robinson stated, patient purchased an O2 sat monitor today. She is monitoring her oxygen level 2 to 3 times daily along with her B/P. Rebecca Robinson reported a B/P 96/60, which she believes is low. Rebecca Robinson and patient knows when to contact the MD for any worsening in symptoms.  Rebecca Robinson reported, PT services ended today. She stated, patient has improved and she is doing much better after being discharged from the hospital. The decision to discontinue PT services was a mutual agreement between PT and patient. RN services will continue with [facility] Rebecca Robinson discussed how patient was giving  exercises for her lungs and she believes patient will complete the exercises as instructed. Per EMR, MD wants patient to wean off the oxygen. Rebecca Robinson explained how patient was very active prior to being hospitalized. She believes her goal is to get her strength back, to be independent, and drive again.  Plan: RN CM will contact patient within one week.  RN CM will send patient EMMI educational materials.  RN CM will send Care Plan to MD.   [place] Bells, RN, BSN, MHA/MSL, [place] Direct Phone: [phone] Cell Phone: [phone] Toll Free: [phone] Fax: [phone]

## 2017-07-26 ENCOUNTER — Telehealth: Payer: Self-pay | Admitting: Pharmacist

## 2017-07-26 ENCOUNTER — Other Ambulatory Visit: Payer: Self-pay | Admitting: *Deleted

## 2017-07-26 NOTE — Patient Outreach (Signed)
[place] [facility]) Care Management  [place]  07/26/2017   [place] Jul 25, 1931 [id number]  S/O: Jamilya Sarrazin is a 84 y.o. female with a history of Atrial Fibrillation, Bronchospasm, CHF, Pulmonary Fibrosis, Sarcoidosis, Anxiety, OSA on CPAP, HTN, and CKD 3. She was admitted to [facility] from 07/09/17 to 07/15/17 diagnosed with Acute Respiratory Failure and Atrial Fibrillation. She was prescribed Diltiazem 300 mg daily, continuous Oxygen, and home health services with [facility] (PT//RN) at discharge.   Patient strength continues to improve daily after being discharged from the hospital. After 2 to 3 sessions with PT, patient and PT mutually agreed that patient could be independent with strength building exercises. PT discontinued home health services on 07/23/17, per patient's daughter [name]). Home health RN will continue to make weekly visits depending on patient's needs.   Patient had a hospital follow-up appointment with her primary MD on 07/20/17. Per EMR, Diltiazem-CD was decreased to 180 mg daily and patient is to wean off oxygen. [name] was unable to determine if patient had decreased her Diltiazem due to having low blood pressure readings. She plans to follow-up with patient regarding medication dosage. [name] reported, patient purchased an O2 Saturation monitor for home to assist with weaning off the oxygen. RN CM assisted patient with scheduling a hospital follow-up appointment with [name] (Pulmonary) on 07/29/17. RN CM assisted patient with obtaining a hospital follow-up appointment with the Nurse Practitioner at the Cardiologist office. Per [name], patient prefers to visit with the MD. RN CM canceled the follow-up appointment. [name] plans to contact Cardiologist office weekly to inquire about an open appointment. Patient has a regular scheduled cardiology appointment on 09/08/17.   Encounter Medications:  Outpatient Encounter Medications as  of 07/26/2017  Medication Sig Note  . antiseptic oral rinse (BIOTENE) LIQD 15 mLs by Mouth Rinse route as needed for dry mouth.   . Calcium Carbonate-Vitamin D (CALCIUM-VITAMIN D) 500-200 MG-UNIT per tablet Take 2 tablets by mouth daily.    . cholecalciferol (VITAMIN D) 400 units TABS tablet Take 400 Units by mouth.   . diltiazem (TIAZAC) 300 MG 24 hr capsule Take 300 mg by mouth daily. May have decreased to 180 mg daily   . ferrous sulfate 325 (65 FE) MG tablet Take 325 mg by mouth.   . fluticasone (FLOVENT HFA) 220 MCG/ACT inhaler Inhale 2 puffs into the lungs 2 (two) times daily.   . furosemide (LASIX) 20 MG tablet Take 20 mg by mouth daily.   . pravastatin (PRAVACHOL) 80 MG tablet TAKE ONE TABLET BY MOUTH ONCE DAILY   . propafenone (RYTHMOL SR) 325 MG 12 hr capsule Take 325 mg by mouth daily.   Marland Kitchen tiotropium (SPIRIVA) 18 MCG inhalation capsule Place 18 mcg into inhaler and inhale daily.   . vitamin B-12 (CYANOCOBALAMIN) 100 MCG tablet Take 100 mcg by mouth daily.   Marland Kitchen lisinopril-hydrochlorothiazide (PRINZIDE,ZESTORETIC) 10-12.5 MG per tablet TAKE ONE-HALF TABLET BY MOUTH ONCE DAILY (Patient not taking: Reported on 07/23/2017)   . lisinopril-hydrochlorothiazide (PRINZIDE,ZESTORETIC) 10-12.5 MG tablet TAKE ONE-HALF TABLET BY MOUTH ONCE DAILY (Patient not taking: Reported on 07/23/2017)   . [DISCONTINUED] albuterol (PROVENTIL HFA;VENTOLIN HFA) 108 (90 BASE) MCG/ACT inhaler Inhale 2 puffs into the lungs every 8 (eight) hours as needed for wheezing or shortness of breath. (Patient not taking: Reported on 07/23/2017)   . [DISCONTINUED] budesonide (PULMICORT) 180 MCG/ACT inhaler Inhale 2 puffs into the lungs 2 (two) times daily. (Patient not taking: Reported on 07/23/2017)   . [  DISCONTINUED] carvedilol (COREG) 3.125 MG tablet TAKE ONE TABLET BY MOUTH TWICE DAILY WITH MEALS (Patient not taking: Reported on 07/21/2017)   . [DISCONTINUED] diltiazem (CARDIZEM) 30 MG tablet Take 30 mg by mouth 3 (three) times  daily. 01/07/2015: Received from: [place]: Take 30 mg by mouth.  . [DISCONTINUED] ELIQUIS 5 MG TABS tablet TAKE ONE TABLET BY MOUTH TWICE DAILY    No facility-administered encounter medications on file as of 07/26/2017.     Functional Status:  In your present state of health, do you have any difficulty performing the following activities: 07/23/2017  Hearing? N  Vision? N  Difficulty concentrating or making decisions? N  Walking or climbing stairs? Y  Dressing or bathing? Y  Doing errands, shopping? Y  Preparing Food and eating ? Y  Using the Toilet? N  In the past six months, have you accidently leaked urine? Y  Do you have problems with loss of bowel control? N  Managing your Medications? Y  Managing your Finances? Y  Housekeeping or managing your Housekeeping? Y  Some recent data might be hidden    Fall/Depression Screening: Fall Risk  07/20/2017 07/20/2017  Falls in the past year? No No   PHQ 2/9 Scores 07/20/2017 07/20/2017  Exception Documentation Other- indicate reason in comment box Other- indicate reason in comment box  Not completed spoke with daughter to complete screening -    Assessment:  [facility] CM Care Plan Problem One     Most Recent Value  Care Plan Problem One  Knowledge deficit related to Heart Failure  (Pended)   Role Documenting the Problem One  Care Management Telephonic Coordinator  (Pended)   [place] for Problem One  Active  (Pended)   THN Long Term Goal   Knowledge deficit related to disease process/management and symptom management  (Pended)   THN Long Term Goal Start Date  07/23/17  (Pended)   Interventions for Problem One Long Term Goal  EMMI education, Explain, Discussion, Teach Back Method  (Pended)   THN CM Short Term Goal #1   Patient will verbalize COPD/CHF action plan/zones within the next 30 days.  (Pended)   THN CM Short Term Goal #1 Start Date  07/23/17  (Pended)   Interventions for Short Term Goal #1  RN CM and daughter  discussed correct use of prescribed inhalers and exercises for patient provided by PT andfor exercising to build strength.    (Pended)   THN CM Short Term Goal #2   Patient will engage with [facility] pharmacist to address med barriers within the next 30 days.   (Pended)   THN CM Short Term Goal #2 Start Date  07/23/17  (Pended)   Interventions for Short Term Goal #2  RN CM and daughter reviewed medications with daughter. RN CM sent referral to [place].  (Pended)       Plan:  RN CM will follow up with patient by phone to ensure that she is progressing and working on exercises, weaning off of Oxygen, taking medications as prescribed, familiar with an CHF/respiratory distress action plan, and address any other questions/concerns.

## 2017-07-26 NOTE — Patient Outreach (Addendum)
[place] [facility]) Care Management  07/26/2017  [place] 28-Oct-1931 [id number]   Called patient per urgent referral about medication assistance post discharge. HIPAA identifiers were obtained. Patient was very hesitant to speak with me but did talk to me for five minutes. Patient said she was not having any issues with her medications and is not in the donut hole yet.    Just as we were beginning to review her allergies, she asked me to call her back on another day.  Plan:  Will call the patient back within 1-3 business days.  Elayne Guerin, PharmD, [place] Clinical Pharmacist [phone]

## 2017-07-27 DIAGNOSIS — J1100 Influenza due to unidentified influenza virus with unspecified type of pneumonia: Secondary | ICD-10-CM | POA: Diagnosis not present

## 2017-07-27 DIAGNOSIS — I481 Persistent atrial fibrillation: Secondary | ICD-10-CM | POA: Diagnosis not present

## 2017-07-27 DIAGNOSIS — I110 Hypertensive heart disease with heart failure: Secondary | ICD-10-CM | POA: Diagnosis not present

## 2017-07-27 DIAGNOSIS — I5030 Unspecified diastolic (congestive) heart failure: Secondary | ICD-10-CM | POA: Diagnosis not present

## 2017-07-29 ENCOUNTER — Other Ambulatory Visit: Payer: Self-pay | Admitting: Pharmacist

## 2017-07-29 ENCOUNTER — Ambulatory Visit: Payer: Self-pay | Admitting: Pharmacist

## 2017-07-29 DIAGNOSIS — I5032 Chronic diastolic (congestive) heart failure: Secondary | ICD-10-CM | POA: Diagnosis not present

## 2017-07-29 DIAGNOSIS — I481 Persistent atrial fibrillation: Secondary | ICD-10-CM | POA: Diagnosis not present

## 2017-07-29 DIAGNOSIS — I5181 Takotsubo syndrome: Secondary | ICD-10-CM | POA: Diagnosis not present

## 2017-07-29 DIAGNOSIS — G4733 Obstructive sleep apnea (adult) (pediatric): Secondary | ICD-10-CM | POA: Diagnosis not present

## 2017-07-29 DIAGNOSIS — J440 Chronic obstructive pulmonary disease with acute lower respiratory infection: Secondary | ICD-10-CM | POA: Diagnosis not present

## 2017-07-29 DIAGNOSIS — J441 Chronic obstructive pulmonary disease with (acute) exacerbation: Secondary | ICD-10-CM | POA: Diagnosis not present

## 2017-07-29 DIAGNOSIS — J1100 Influenza due to unidentified influenza virus with unspecified type of pneumonia: Secondary | ICD-10-CM | POA: Diagnosis not present

## 2017-07-29 NOTE — Patient Outreach (Signed)
[facility]) Care Management  07/29/2017  [place] 05-Mar-1932 [id number]   Patient was called regarding post discharge medication review/reconciliation. Unfortunately, she did not answer the phone. Patient was also called yesterday but asked that I call her back later as she was unable to speak at the time of my call.  Plan: Patient will be sent an unsuccessful contact letter per our protocol.   Elayne Guerin, PharmD, [place] Clinical Pharmacist [phone]

## 2017-08-05 DIAGNOSIS — G4733 Obstructive sleep apnea (adult) (pediatric): Secondary | ICD-10-CM | POA: Diagnosis not present

## 2017-08-06 ENCOUNTER — Ambulatory Visit: Payer: Self-pay | Admitting: *Deleted

## 2017-08-12 ENCOUNTER — Other Ambulatory Visit: Payer: Self-pay | Admitting: Pharmacist

## 2017-08-12 DIAGNOSIS — J441 Chronic obstructive pulmonary disease with (acute) exacerbation: Secondary | ICD-10-CM | POA: Diagnosis not present

## 2017-08-13 ENCOUNTER — Other Ambulatory Visit: Payer: Self-pay

## 2017-08-13 NOTE — Patient Outreach (Addendum)
[facility]) Care Management  08/13/2017  [place] 1932/04/09 [id number]   Referral Date: 07/19/17 Referral Source: HTA [facility] Date of Discharge: 07/15/17 Facility: [facility] Discharge Diagnosis: Flu, Pneumonia, COPD exacerbation Insurance: HTA Attempt #1  Telephone call to patient regarding transition of care follow up. Unable to reach patient. HIPAA compliant voice message left with call back phone number.   PLAN; RNCM will attempt 3rd telephone call within 1 week.  RNCM will send patient outreach letter to attempt contact.   Quinn Plowman RN,BSN,CCM [facility] Telephonic  [phone]

## 2017-08-13 NOTE — Patient Outreach (Signed)
[place] [facility]) Care Management  [place]  [place] for 08/12/17  08/13/2017  [place] [birth year] [id number]  Subjective: Patient was called regarding medication assistance. HIPAA identifiers were obtained. Patient is an 84 year old female with multiple medical conditions including but not limited to: A fib, hypertension, and pulmonary fibrosis.  Patient declined pharmacy services but agreed to reviewing her medications with me over the phone.  Patient reported managing her medications on her own.   Objective:   Encounter Medications: Outpatient Encounter Medications as of 08/12/2017  Medication Sig  . antiseptic oral rinse (BIOTENE) LIQD 15 mLs by Mouth Rinse route as needed for dry mouth.  . Calcium Carbonate-Vitamin D (CALCIUM-VITAMIN D) 500-200 MG-UNIT per tablet Take 2 tablets by mouth daily.   . cholecalciferol (VITAMIN D) 400 units TABS tablet Take 400 Units by mouth.  . diltiazem (TIAZAC) 300 MG 24 hr capsule Take 300 mg by mouth daily. May have decreased to 180 mg daily  . Fluticasone Furoate 200 MCG/ACT AEPB Take 1 puff by mouth daily.  . furosemide (LASIX) 20 MG tablet Take 20 mg by mouth daily.  . propafenone (RYTHMOL SR) 325 MG 12 hr capsule Take 325 mg by mouth daily.  . vitamin B-12 (CYANOCOBALAMIN) 100 MCG tablet Take 100 mcg by mouth daily.  . ferrous sulfate 325 (65 FE) MG tablet Take 325 mg by mouth.  . fluticasone (FLOVENT HFA) 220 MCG/ACT inhaler Inhale 2 puffs into the lungs 2 (two) times daily.  Marland Kitchen lisinopril-hydrochlorothiazide (PRINZIDE,ZESTORETIC) 10-12.5 MG per tablet TAKE ONE-HALF TABLET BY MOUTH ONCE DAILY (Patient not taking: Reported on 07/23/2017)  . nitroGLYCERIN (NITROSTAT) 0.4 MG SL tablet Place 1 tablet under the tongue as needed.  . pravastatin (PRAVACHOL) 80 MG tablet TAKE ONE TABLET BY MOUTH ONCE DAILY  . tiotropium (SPIRIVA) 18 MCG inhalation capsule Place 18 mcg into inhaler and inhale daily.  . [DISCONTINUED]  lisinopril-hydrochlorothiazide (PRINZIDE,ZESTORETIC) 10-12.5 MG tablet TAKE ONE-HALF TABLET BY MOUTH ONCE DAILY (Patient not taking: Reported on 07/23/2017)   No facility-administered encounter medications on file as of 08/12/2017.     Functional Status: In your present state of health, do you have any difficulty performing the following activities: 07/23/2017  Hearing? N  Vision? N  Difficulty concentrating or making decisions? N  Walking or climbing stairs? Y  Dressing or bathing? Y  Doing errands, shopping? Y  Preparing Food and eating ? Y  Using the Toilet? N  In the past six months, have you accidently leaked urine? Y  Do you have problems with loss of bowel control? N  Managing your Medications? Y  Managing your Finances? Y  Housekeeping or managing your Housekeeping? Y  Some recent data might be hidden    Fall/Depression Screening: Fall Risk  07/20/2017 07/20/2017  Falls in the past year? No No   PHQ 2/9 Scores 07/20/2017 07/20/2017  Exception Documentation Other- indicate reason in comment box Other- indicate reason in comment box  Not completed spoke with daughter to complete screening -      Assessment:  Patient's medications were reviewed via telephone:   Drugs sorted by system:  Neurologic/Psychologic:  Cardiovascular: Diltiazem Furosemide Rythmol Lisinopril/HCTZ Nitroglycerin Pravastatin  Pulmonary/Allergy: Flovent HFA Spiriva  Vitamins/Minerals: Calcium Carbonate-Vitamin D Cholecalciferol Vitamin B-12 Ferrous Sulfate  Infectious Diseases:  Miscellaneous: Biotene   Plan: Close patient's case since she denied pharmacy services and said she was not having difficulty paying for her medications.  Elayne Guerin, PharmD, [place] Clinical Pharmacist [phone]

## 2017-08-16 DIAGNOSIS — G4733 Obstructive sleep apnea (adult) (pediatric): Secondary | ICD-10-CM | POA: Diagnosis not present

## 2017-08-16 DIAGNOSIS — I481 Persistent atrial fibrillation: Secondary | ICD-10-CM | POA: Diagnosis not present

## 2017-08-16 DIAGNOSIS — I110 Hypertensive heart disease with heart failure: Secondary | ICD-10-CM | POA: Diagnosis not present

## 2017-08-16 DIAGNOSIS — Z7901 Long term (current) use of anticoagulants: Secondary | ICD-10-CM | POA: Diagnosis not present

## 2017-08-18 ENCOUNTER — Other Ambulatory Visit: Payer: Self-pay

## 2017-08-18 NOTE — Patient Outreach (Signed)
[place] [facility]) Care Management  08/18/2017  [place] [birth year] [id number]   Second telephone call to patient for transition of care follow up. Unable to reach patient. HIPAA compliant voice message left with call back phone number.   RNCM will attempt 3rd telephone call to patient within 5 business days.  Outreach letter sent to attempt contact.   Quinn Plowman RN,BSN,CCM [facility] Telephonic  [phone]

## 2017-08-27 ENCOUNTER — Other Ambulatory Visit: Payer: Self-pay

## 2017-08-27 ENCOUNTER — Ambulatory Visit: Payer: PPO

## 2017-08-27 NOTE — Patient Outreach (Signed)
[place] [facility]) Care Management  08/27/2017  [place] [birth year] [id number]   Third telephone call to patient for follow up. Unable to reach patient.  No response from patient after 3 telephone calls and outreach letter attempt.   PLAN: RNCM will close out case. RNCM will send patient and primary MD closure notification.   Quinn Plowman RN,BSN,CCM [facility] Telephonic  [phone]

## 2017-09-02 DIAGNOSIS — H3561 Retinal hemorrhage, right eye: Secondary | ICD-10-CM | POA: Diagnosis not present

## 2017-09-02 DIAGNOSIS — Z01818 Encounter for other preprocedural examination: Secondary | ICD-10-CM | POA: Diagnosis not present

## 2017-09-02 DIAGNOSIS — H4311 Vitreous hemorrhage, right eye: Secondary | ICD-10-CM | POA: Diagnosis not present

## 2017-09-08 DIAGNOSIS — I4510 Unspecified right bundle-branch block: Secondary | ICD-10-CM | POA: Diagnosis not present

## 2017-09-08 DIAGNOSIS — J849 Interstitial pulmonary disease, unspecified: Secondary | ICD-10-CM | POA: Diagnosis not present

## 2017-09-08 DIAGNOSIS — I445 Left posterior fascicular block: Secondary | ICD-10-CM | POA: Diagnosis not present

## 2017-09-08 DIAGNOSIS — I4891 Unspecified atrial fibrillation: Secondary | ICD-10-CM | POA: Diagnosis not present

## 2017-09-08 DIAGNOSIS — I481 Persistent atrial fibrillation: Secondary | ICD-10-CM | POA: Diagnosis not present

## 2017-09-08 DIAGNOSIS — I110 Hypertensive heart disease with heart failure: Secondary | ICD-10-CM | POA: Diagnosis not present

## 2017-09-08 DIAGNOSIS — I5181 Takotsubo syndrome: Secondary | ICD-10-CM | POA: Diagnosis not present

## 2017-09-15 DIAGNOSIS — H3561 Retinal hemorrhage, right eye: Secondary | ICD-10-CM | POA: Diagnosis not present

## 2017-10-05 DIAGNOSIS — D869 Sarcoidosis, unspecified: Secondary | ICD-10-CM | POA: Diagnosis not present

## 2017-10-05 DIAGNOSIS — I5032 Chronic diastolic (congestive) heart failure: Secondary | ICD-10-CM | POA: Diagnosis not present

## 2017-10-05 DIAGNOSIS — J301 Allergic rhinitis due to pollen: Secondary | ICD-10-CM | POA: Diagnosis not present

## 2017-10-15 DIAGNOSIS — D869 Sarcoidosis, unspecified: Secondary | ICD-10-CM | POA: Diagnosis not present

## 2017-10-15 DIAGNOSIS — J8410 Pulmonary fibrosis, unspecified: Secondary | ICD-10-CM | POA: Diagnosis not present

## 2017-10-15 DIAGNOSIS — J301 Allergic rhinitis due to pollen: Secondary | ICD-10-CM | POA: Diagnosis not present

## 2017-10-15 DIAGNOSIS — J849 Interstitial pulmonary disease, unspecified: Secondary | ICD-10-CM | POA: Diagnosis not present

## 2017-10-20 DIAGNOSIS — M654 Radial styloid tenosynovitis [de Quervain]: Secondary | ICD-10-CM | POA: Diagnosis not present

## 2017-10-25 DIAGNOSIS — M654 Radial styloid tenosynovitis [de Quervain]: Secondary | ICD-10-CM | POA: Diagnosis not present

## 2017-11-02 DIAGNOSIS — N183 Chronic kidney disease, stage 3 (moderate): Secondary | ICD-10-CM | POA: Diagnosis not present

## 2017-11-02 DIAGNOSIS — I130 Hypertensive heart and chronic kidney disease with heart failure and stage 1 through stage 4 chronic kidney disease, or unspecified chronic kidney disease: Secondary | ICD-10-CM | POA: Diagnosis not present

## 2017-11-02 DIAGNOSIS — I5032 Chronic diastolic (congestive) heart failure: Secondary | ICD-10-CM | POA: Diagnosis not present

## 2017-11-02 DIAGNOSIS — I481 Persistent atrial fibrillation: Secondary | ICD-10-CM | POA: Diagnosis not present

## 2017-11-17 DIAGNOSIS — M654 Radial styloid tenosynovitis [de Quervain]: Secondary | ICD-10-CM | POA: Diagnosis not present

## 2017-11-26 DIAGNOSIS — I4891 Unspecified atrial fibrillation: Secondary | ICD-10-CM | POA: Diagnosis not present

## 2017-11-26 DIAGNOSIS — I493 Ventricular premature depolarization: Secondary | ICD-10-CM | POA: Diagnosis not present

## 2017-11-26 DIAGNOSIS — I440 Atrioventricular block, first degree: Secondary | ICD-10-CM | POA: Diagnosis not present

## 2017-11-26 DIAGNOSIS — I481 Persistent atrial fibrillation: Secondary | ICD-10-CM | POA: Diagnosis not present

## 2017-11-26 DIAGNOSIS — I4510 Unspecified right bundle-branch block: Secondary | ICD-10-CM | POA: Diagnosis not present

## 2017-12-17 DIAGNOSIS — J849 Interstitial pulmonary disease, unspecified: Secondary | ICD-10-CM | POA: Diagnosis not present

## 2017-12-17 DIAGNOSIS — G4733 Obstructive sleep apnea (adult) (pediatric): Secondary | ICD-10-CM | POA: Diagnosis not present

## 2017-12-17 DIAGNOSIS — J8410 Pulmonary fibrosis, unspecified: Secondary | ICD-10-CM | POA: Diagnosis not present

## 2017-12-17 DIAGNOSIS — N183 Chronic kidney disease, stage 3 (moderate): Secondary | ICD-10-CM | POA: Diagnosis not present

## 2017-12-20 DIAGNOSIS — I4891 Unspecified atrial fibrillation: Secondary | ICD-10-CM | POA: Diagnosis not present

## 2017-12-20 DIAGNOSIS — I444 Left anterior fascicular block: Secondary | ICD-10-CM | POA: Diagnosis not present

## 2017-12-20 DIAGNOSIS — I452 Bifascicular block: Secondary | ICD-10-CM | POA: Diagnosis not present

## 2017-12-23 DIAGNOSIS — J849 Interstitial pulmonary disease, unspecified: Secondary | ICD-10-CM | POA: Diagnosis not present

## 2017-12-23 DIAGNOSIS — R0602 Shortness of breath: Secondary | ICD-10-CM | POA: Diagnosis not present

## 2017-12-23 DIAGNOSIS — J8410 Pulmonary fibrosis, unspecified: Secondary | ICD-10-CM | POA: Diagnosis not present

## 2017-12-30 DIAGNOSIS — G4733 Obstructive sleep apnea (adult) (pediatric): Secondary | ICD-10-CM | POA: Diagnosis not present

## 2018-01-20 DIAGNOSIS — N183 Chronic kidney disease, stage 3 (moderate): Secondary | ICD-10-CM | POA: Diagnosis not present

## 2018-01-20 DIAGNOSIS — G4733 Obstructive sleep apnea (adult) (pediatric): Secondary | ICD-10-CM | POA: Diagnosis not present

## 2018-01-20 DIAGNOSIS — R600 Localized edema: Secondary | ICD-10-CM | POA: Diagnosis not present

## 2018-01-20 DIAGNOSIS — Z7901 Long term (current) use of anticoagulants: Secondary | ICD-10-CM | POA: Diagnosis not present

## 2018-01-20 DIAGNOSIS — I481 Persistent atrial fibrillation: Secondary | ICD-10-CM | POA: Diagnosis not present

## 2018-01-20 DIAGNOSIS — Z9989 Dependence on other enabling machines and devices: Secondary | ICD-10-CM | POA: Diagnosis not present

## 2018-01-20 DIAGNOSIS — J849 Interstitial pulmonary disease, unspecified: Secondary | ICD-10-CM | POA: Diagnosis not present

## 2018-01-20 DIAGNOSIS — I5032 Chronic diastolic (congestive) heart failure: Secondary | ICD-10-CM | POA: Diagnosis not present

## 2018-01-25 DIAGNOSIS — J984 Other disorders of lung: Secondary | ICD-10-CM | POA: Diagnosis not present

## 2018-01-25 DIAGNOSIS — R590 Localized enlarged lymph nodes: Secondary | ICD-10-CM | POA: Diagnosis not present

## 2018-01-25 DIAGNOSIS — R918 Other nonspecific abnormal finding of lung field: Secondary | ICD-10-CM | POA: Diagnosis not present

## 2018-01-25 DIAGNOSIS — J849 Interstitial pulmonary disease, unspecified: Secondary | ICD-10-CM | POA: Diagnosis not present

## 2018-01-28 DIAGNOSIS — G4733 Obstructive sleep apnea (adult) (pediatric): Secondary | ICD-10-CM | POA: Diagnosis not present

## 2018-01-28 DIAGNOSIS — I5032 Chronic diastolic (congestive) heart failure: Secondary | ICD-10-CM | POA: Diagnosis not present

## 2018-01-28 DIAGNOSIS — J849 Interstitial pulmonary disease, unspecified: Secondary | ICD-10-CM | POA: Diagnosis not present

## 2018-01-28 DIAGNOSIS — Z9989 Dependence on other enabling machines and devices: Secondary | ICD-10-CM | POA: Diagnosis not present

## 2018-01-31 DIAGNOSIS — H40003 Preglaucoma, unspecified, bilateral: Secondary | ICD-10-CM | POA: Diagnosis not present

## 2018-02-02 DIAGNOSIS — I5181 Takotsubo syndrome: Secondary | ICD-10-CM | POA: Diagnosis not present

## 2018-02-02 DIAGNOSIS — I481 Persistent atrial fibrillation: Secondary | ICD-10-CM | POA: Diagnosis not present

## 2018-02-02 DIAGNOSIS — Z7901 Long term (current) use of anticoagulants: Secondary | ICD-10-CM | POA: Diagnosis not present

## 2018-02-02 DIAGNOSIS — D869 Sarcoidosis, unspecified: Secondary | ICD-10-CM | POA: Diagnosis not present

## 2018-02-02 DIAGNOSIS — E7800 Pure hypercholesterolemia, unspecified: Secondary | ICD-10-CM | POA: Diagnosis not present

## 2018-02-02 DIAGNOSIS — I5030 Unspecified diastolic (congestive) heart failure: Secondary | ICD-10-CM | POA: Diagnosis not present

## 2018-02-02 DIAGNOSIS — I4891 Unspecified atrial fibrillation: Secondary | ICD-10-CM | POA: Diagnosis not present

## 2018-02-02 DIAGNOSIS — N183 Chronic kidney disease, stage 3 (moderate): Secondary | ICD-10-CM | POA: Diagnosis not present

## 2018-02-02 DIAGNOSIS — G4733 Obstructive sleep apnea (adult) (pediatric): Secondary | ICD-10-CM | POA: Diagnosis not present

## 2018-02-02 DIAGNOSIS — I110 Hypertensive heart disease with heart failure: Secondary | ICD-10-CM | POA: Diagnosis not present

## 2018-02-02 DIAGNOSIS — Z9989 Dependence on other enabling machines and devices: Secondary | ICD-10-CM | POA: Diagnosis not present

## 2018-02-02 DIAGNOSIS — Z8679 Personal history of other diseases of the circulatory system: Secondary | ICD-10-CM | POA: Diagnosis not present

## 2018-02-02 DIAGNOSIS — J849 Interstitial pulmonary disease, unspecified: Secondary | ICD-10-CM | POA: Diagnosis not present

## 2018-02-03 DIAGNOSIS — J849 Interstitial pulmonary disease, unspecified: Secondary | ICD-10-CM | POA: Diagnosis not present

## 2018-02-03 DIAGNOSIS — E7800 Pure hypercholesterolemia, unspecified: Secondary | ICD-10-CM | POA: Diagnosis not present

## 2018-02-03 DIAGNOSIS — F418 Other specified anxiety disorders: Secondary | ICD-10-CM | POA: Diagnosis not present

## 2018-02-03 DIAGNOSIS — I130 Hypertensive heart and chronic kidney disease with heart failure and stage 1 through stage 4 chronic kidney disease, or unspecified chronic kidney disease: Secondary | ICD-10-CM | POA: Diagnosis not present

## 2018-02-03 DIAGNOSIS — Z7901 Long term (current) use of anticoagulants: Secondary | ICD-10-CM | POA: Diagnosis not present

## 2018-02-03 DIAGNOSIS — Z636 Dependent relative needing care at home: Secondary | ICD-10-CM | POA: Diagnosis not present

## 2018-02-03 DIAGNOSIS — I5030 Unspecified diastolic (congestive) heart failure: Secondary | ICD-10-CM | POA: Diagnosis not present

## 2018-02-03 DIAGNOSIS — N183 Chronic kidney disease, stage 3 (moderate): Secondary | ICD-10-CM | POA: Diagnosis not present

## 2018-02-08 DIAGNOSIS — I4510 Unspecified right bundle-branch block: Secondary | ICD-10-CM | POA: Diagnosis not present

## 2018-02-08 DIAGNOSIS — J849 Interstitial pulmonary disease, unspecified: Secondary | ICD-10-CM | POA: Diagnosis not present

## 2018-02-08 DIAGNOSIS — I4891 Unspecified atrial fibrillation: Secondary | ICD-10-CM | POA: Diagnosis not present

## 2018-02-08 DIAGNOSIS — I481 Persistent atrial fibrillation: Secondary | ICD-10-CM | POA: Diagnosis not present

## 2018-02-08 DIAGNOSIS — I5181 Takotsubo syndrome: Secondary | ICD-10-CM | POA: Diagnosis not present

## 2018-02-11 DIAGNOSIS — I4891 Unspecified atrial fibrillation: Secondary | ICD-10-CM | POA: Diagnosis not present

## 2018-02-14 DIAGNOSIS — I444 Left anterior fascicular block: Secondary | ICD-10-CM | POA: Diagnosis not present

## 2018-02-14 DIAGNOSIS — I4891 Unspecified atrial fibrillation: Secondary | ICD-10-CM | POA: Diagnosis not present

## 2018-02-14 DIAGNOSIS — I452 Bifascicular block: Secondary | ICD-10-CM | POA: Diagnosis not present

## 2018-02-24 DIAGNOSIS — I4891 Unspecified atrial fibrillation: Secondary | ICD-10-CM | POA: Diagnosis not present

## 2018-03-11 DIAGNOSIS — I4819 Other persistent atrial fibrillation: Secondary | ICD-10-CM | POA: Diagnosis not present

## 2018-03-11 DIAGNOSIS — I5032 Chronic diastolic (congestive) heart failure: Secondary | ICD-10-CM | POA: Diagnosis not present

## 2018-03-11 DIAGNOSIS — G4733 Obstructive sleep apnea (adult) (pediatric): Secondary | ICD-10-CM | POA: Diagnosis not present

## 2018-03-11 DIAGNOSIS — J849 Interstitial pulmonary disease, unspecified: Secondary | ICD-10-CM | POA: Diagnosis not present

## 2018-03-11 DIAGNOSIS — Z9989 Dependence on other enabling machines and devices: Secondary | ICD-10-CM | POA: Diagnosis not present

## 2018-03-11 DIAGNOSIS — I5030 Unspecified diastolic (congestive) heart failure: Secondary | ICD-10-CM | POA: Diagnosis not present

## 2018-03-11 DIAGNOSIS — I110 Hypertensive heart disease with heart failure: Secondary | ICD-10-CM | POA: Diagnosis not present

## 2018-04-02 DIAGNOSIS — L309 Dermatitis, unspecified: Secondary | ICD-10-CM | POA: Diagnosis not present

## 2018-04-04 DIAGNOSIS — Z23 Encounter for immunization: Secondary | ICD-10-CM | POA: Diagnosis not present

## 2018-04-19 DIAGNOSIS — D869 Sarcoidosis, unspecified: Secondary | ICD-10-CM | POA: Diagnosis not present

## 2018-04-19 DIAGNOSIS — J849 Interstitial pulmonary disease, unspecified: Secondary | ICD-10-CM | POA: Diagnosis not present

## 2018-04-19 DIAGNOSIS — J069 Acute upper respiratory infection, unspecified: Secondary | ICD-10-CM | POA: Diagnosis not present

## 2018-04-19 DIAGNOSIS — J8410 Pulmonary fibrosis, unspecified: Secondary | ICD-10-CM | POA: Diagnosis not present

## 2018-04-19 DIAGNOSIS — I4819 Other persistent atrial fibrillation: Secondary | ICD-10-CM | POA: Diagnosis not present

## 2018-04-19 DIAGNOSIS — I5032 Chronic diastolic (congestive) heart failure: Secondary | ICD-10-CM | POA: Diagnosis not present

## 2018-04-29 DIAGNOSIS — Z0000 Encounter for general adult medical examination without abnormal findings: Secondary | ICD-10-CM | POA: Diagnosis not present

## 2018-05-04 DIAGNOSIS — J8410 Pulmonary fibrosis, unspecified: Secondary | ICD-10-CM | POA: Diagnosis not present

## 2018-05-04 DIAGNOSIS — I482 Chronic atrial fibrillation, unspecified: Secondary | ICD-10-CM | POA: Diagnosis not present

## 2018-05-04 DIAGNOSIS — I110 Hypertensive heart disease with heart failure: Secondary | ICD-10-CM | POA: Diagnosis not present

## 2018-05-04 DIAGNOSIS — G4733 Obstructive sleep apnea (adult) (pediatric): Secondary | ICD-10-CM | POA: Diagnosis not present

## 2018-05-04 DIAGNOSIS — J849 Interstitial pulmonary disease, unspecified: Secondary | ICD-10-CM | POA: Diagnosis not present

## 2018-05-04 DIAGNOSIS — N183 Chronic kidney disease, stage 3 (moderate): Secondary | ICD-10-CM | POA: Diagnosis not present

## 2018-05-04 DIAGNOSIS — I5181 Takotsubo syndrome: Secondary | ICD-10-CM | POA: Diagnosis not present

## 2018-05-04 DIAGNOSIS — I5030 Unspecified diastolic (congestive) heart failure: Secondary | ICD-10-CM | POA: Diagnosis not present

## 2018-05-04 DIAGNOSIS — Z9989 Dependence on other enabling machines and devices: Secondary | ICD-10-CM | POA: Diagnosis not present

## 2018-05-10 DIAGNOSIS — G4733 Obstructive sleep apnea (adult) (pediatric): Secondary | ICD-10-CM | POA: Diagnosis not present

## 2018-05-25 DIAGNOSIS — J069 Acute upper respiratory infection, unspecified: Secondary | ICD-10-CM | POA: Diagnosis not present

## 2018-05-25 DIAGNOSIS — J849 Interstitial pulmonary disease, unspecified: Secondary | ICD-10-CM | POA: Diagnosis not present

## 2018-06-07 DIAGNOSIS — J8410 Pulmonary fibrosis, unspecified: Secondary | ICD-10-CM | POA: Diagnosis not present

## 2018-06-07 DIAGNOSIS — D869 Sarcoidosis, unspecified: Secondary | ICD-10-CM | POA: Diagnosis not present

## 2018-06-07 DIAGNOSIS — J209 Acute bronchitis, unspecified: Secondary | ICD-10-CM | POA: Diagnosis not present

## 2018-06-07 DIAGNOSIS — J849 Interstitial pulmonary disease, unspecified: Secondary | ICD-10-CM | POA: Diagnosis not present

## 2018-06-07 DIAGNOSIS — J069 Acute upper respiratory infection, unspecified: Secondary | ICD-10-CM | POA: Diagnosis not present

## 2018-06-08 DIAGNOSIS — I5032 Chronic diastolic (congestive) heart failure: Secondary | ICD-10-CM | POA: Diagnosis not present

## 2018-06-08 DIAGNOSIS — J189 Pneumonia, unspecified organism: Secondary | ICD-10-CM | POA: Diagnosis not present

## 2018-06-08 DIAGNOSIS — J849 Interstitial pulmonary disease, unspecified: Secondary | ICD-10-CM | POA: Diagnosis not present

## 2018-06-08 DIAGNOSIS — J440 Chronic obstructive pulmonary disease with acute lower respiratory infection: Secondary | ICD-10-CM | POA: Diagnosis not present

## 2018-06-08 DIAGNOSIS — I130 Hypertensive heart and chronic kidney disease with heart failure and stage 1 through stage 4 chronic kidney disease, or unspecified chronic kidney disease: Secondary | ICD-10-CM | POA: Diagnosis not present

## 2018-06-08 DIAGNOSIS — N390 Urinary tract infection, site not specified: Secondary | ICD-10-CM | POA: Diagnosis not present

## 2018-06-08 DIAGNOSIS — B952 Enterococcus as the cause of diseases classified elsewhere: Secondary | ICD-10-CM | POA: Diagnosis not present

## 2018-06-08 DIAGNOSIS — J9600 Acute respiratory failure, unspecified whether with hypoxia or hypercapnia: Secondary | ICD-10-CM | POA: Diagnosis not present

## 2018-06-08 DIAGNOSIS — N183 Chronic kidney disease, stage 3 (moderate): Secondary | ICD-10-CM | POA: Diagnosis not present

## 2018-06-08 DIAGNOSIS — Z66 Do not resuscitate: Secondary | ICD-10-CM | POA: Diagnosis not present

## 2018-06-08 DIAGNOSIS — I4891 Unspecified atrial fibrillation: Secondary | ICD-10-CM | POA: Diagnosis not present

## 2018-06-08 DIAGNOSIS — J9601 Acute respiratory failure with hypoxia: Secondary | ICD-10-CM | POA: Diagnosis not present

## 2018-06-08 DIAGNOSIS — Z79899 Other long term (current) drug therapy: Secondary | ICD-10-CM | POA: Diagnosis not present

## 2018-06-08 DIAGNOSIS — R05 Cough: Secondary | ICD-10-CM | POA: Diagnosis not present

## 2018-06-08 DIAGNOSIS — E785 Hyperlipidemia, unspecified: Secondary | ICD-10-CM | POA: Diagnosis not present

## 2018-06-08 DIAGNOSIS — Z888 Allergy status to other drugs, medicaments and biological substances status: Secondary | ICD-10-CM | POA: Diagnosis not present

## 2018-06-08 DIAGNOSIS — M1990 Unspecified osteoarthritis, unspecified site: Secondary | ICD-10-CM | POA: Diagnosis not present

## 2018-06-08 DIAGNOSIS — R0902 Hypoxemia: Secondary | ICD-10-CM | POA: Diagnosis not present

## 2018-06-08 DIAGNOSIS — R531 Weakness: Secondary | ICD-10-CM | POA: Diagnosis not present

## 2018-06-13 ENCOUNTER — Other Ambulatory Visit: Payer: Self-pay

## 2018-06-13 NOTE — Patient Outreach (Signed)
[facility]) Care Management  06/13/2018  [place] [birth year] [id number]     Transition of Care Referral  Referral Date: 06/13/2018 Referral Source: HTA Discharge Report Date of Admission: unknown Diagnosis: unknown Date of Discharge: 06/10/2018 Facility: [place]: HTA    Outreach attempt # 1 to patient. Spoke with patient who immediately voiced that she did not want to talk. RN CM attempted to explain purpose of call and patient reported not interested and that she was "fine and didn't need anything." She then proceeded to hang up the phone and end call.    Plan: RN CM will close case at this time.   Enzo Montgomery, RN,BSN,CCM [place] Management Telephonic Care Management Coordinator Direct Phone: [phone] Toll Free: [phone] Fax: [phone]

## 2018-06-16 DIAGNOSIS — J189 Pneumonia, unspecified organism: Secondary | ICD-10-CM | POA: Diagnosis not present

## 2018-06-16 DIAGNOSIS — I5032 Chronic diastolic (congestive) heart failure: Secondary | ICD-10-CM | POA: Diagnosis not present

## 2018-06-16 DIAGNOSIS — N183 Chronic kidney disease, stage 3 (moderate): Secondary | ICD-10-CM | POA: Diagnosis not present

## 2018-06-16 DIAGNOSIS — Z7901 Long term (current) use of anticoagulants: Secondary | ICD-10-CM | POA: Diagnosis not present

## 2018-06-16 DIAGNOSIS — I4891 Unspecified atrial fibrillation: Secondary | ICD-10-CM | POA: Diagnosis not present

## 2018-06-16 DIAGNOSIS — J849 Interstitial pulmonary disease, unspecified: Secondary | ICD-10-CM | POA: Diagnosis not present

## 2018-06-16 DIAGNOSIS — I482 Chronic atrial fibrillation, unspecified: Secondary | ICD-10-CM | POA: Diagnosis not present

## 2018-06-30 DIAGNOSIS — B029 Zoster without complications: Secondary | ICD-10-CM | POA: Diagnosis not present

## 2018-07-04 DIAGNOSIS — G4733 Obstructive sleep apnea (adult) (pediatric): Secondary | ICD-10-CM | POA: Diagnosis not present

## 2018-07-06 DIAGNOSIS — R918 Other nonspecific abnormal finding of lung field: Secondary | ICD-10-CM | POA: Diagnosis not present

## 2018-07-06 DIAGNOSIS — R531 Weakness: Secondary | ICD-10-CM | POA: Diagnosis not present

## 2018-07-06 DIAGNOSIS — I4891 Unspecified atrial fibrillation: Secondary | ICD-10-CM | POA: Diagnosis not present

## 2018-07-06 DIAGNOSIS — Z7902 Long term (current) use of antithrombotics/antiplatelets: Secondary | ICD-10-CM | POA: Diagnosis not present

## 2018-07-06 DIAGNOSIS — R42 Dizziness and giddiness: Secondary | ICD-10-CM | POA: Diagnosis not present

## 2018-07-06 DIAGNOSIS — E860 Dehydration: Secondary | ICD-10-CM | POA: Diagnosis not present

## 2018-07-06 DIAGNOSIS — I959 Hypotension, unspecified: Secondary | ICD-10-CM | POA: Diagnosis not present

## 2018-07-08 DIAGNOSIS — B0229 Other postherpetic nervous system involvement: Secondary | ICD-10-CM | POA: Diagnosis not present

## 2018-07-08 DIAGNOSIS — B028 Zoster with other complications: Secondary | ICD-10-CM | POA: Diagnosis not present

## 2018-07-12 DIAGNOSIS — B029 Zoster without complications: Secondary | ICD-10-CM | POA: Diagnosis not present

## 2018-07-12 DIAGNOSIS — Z6827 Body mass index (BMI) 27.0-27.9, adult: Secondary | ICD-10-CM | POA: Diagnosis not present

## 2018-07-12 DIAGNOSIS — J449 Chronic obstructive pulmonary disease, unspecified: Secondary | ICD-10-CM | POA: Diagnosis not present

## 2018-07-12 DIAGNOSIS — I482 Chronic atrial fibrillation, unspecified: Secondary | ICD-10-CM | POA: Diagnosis not present

## 2018-07-20 DIAGNOSIS — B029 Zoster without complications: Secondary | ICD-10-CM | POA: Diagnosis not present

## 2018-07-20 DIAGNOSIS — I509 Heart failure, unspecified: Secondary | ICD-10-CM | POA: Diagnosis not present

## 2018-07-20 DIAGNOSIS — I130 Hypertensive heart and chronic kidney disease with heart failure and stage 1 through stage 4 chronic kidney disease, or unspecified chronic kidney disease: Secondary | ICD-10-CM | POA: Diagnosis not present

## 2018-07-20 DIAGNOSIS — N183 Chronic kidney disease, stage 3 (moderate): Secondary | ICD-10-CM | POA: Diagnosis not present

## 2018-07-20 DIAGNOSIS — R05 Cough: Secondary | ICD-10-CM | POA: Diagnosis not present

## 2018-07-20 DIAGNOSIS — M1990 Unspecified osteoarthritis, unspecified site: Secondary | ICD-10-CM | POA: Diagnosis not present

## 2018-07-20 DIAGNOSIS — R531 Weakness: Secondary | ICD-10-CM | POA: Diagnosis not present

## 2018-07-20 DIAGNOSIS — J449 Chronic obstructive pulmonary disease, unspecified: Secondary | ICD-10-CM | POA: Diagnosis not present

## 2018-07-20 DIAGNOSIS — I4891 Unspecified atrial fibrillation: Secondary | ICD-10-CM | POA: Diagnosis not present

## 2018-07-22 DIAGNOSIS — Z7901 Long term (current) use of anticoagulants: Secondary | ICD-10-CM | POA: Diagnosis not present

## 2018-07-22 DIAGNOSIS — E785 Hyperlipidemia, unspecified: Secondary | ICD-10-CM | POA: Diagnosis not present

## 2018-07-22 DIAGNOSIS — J441 Chronic obstructive pulmonary disease with (acute) exacerbation: Secondary | ICD-10-CM | POA: Diagnosis not present

## 2018-07-22 DIAGNOSIS — G4733 Obstructive sleep apnea (adult) (pediatric): Secondary | ICD-10-CM | POA: Diagnosis not present

## 2018-07-22 DIAGNOSIS — R531 Weakness: Secondary | ICD-10-CM | POA: Diagnosis not present

## 2018-07-22 DIAGNOSIS — E875 Hyperkalemia: Secondary | ICD-10-CM | POA: Diagnosis not present

## 2018-07-22 DIAGNOSIS — R062 Wheezing: Secondary | ICD-10-CM | POA: Diagnosis not present

## 2018-07-22 DIAGNOSIS — N183 Chronic kidney disease, stage 3 (moderate): Secondary | ICD-10-CM | POA: Diagnosis not present

## 2018-07-22 DIAGNOSIS — Z79899 Other long term (current) drug therapy: Secondary | ICD-10-CM | POA: Diagnosis not present

## 2018-07-22 DIAGNOSIS — I4891 Unspecified atrial fibrillation: Secondary | ICD-10-CM | POA: Diagnosis not present

## 2018-07-22 DIAGNOSIS — E7800 Pure hypercholesterolemia, unspecified: Secondary | ICD-10-CM | POA: Diagnosis not present

## 2018-07-22 DIAGNOSIS — E871 Hypo-osmolality and hyponatremia: Secondary | ICD-10-CM | POA: Diagnosis not present

## 2018-07-22 DIAGNOSIS — E860 Dehydration: Secondary | ICD-10-CM

## 2018-07-22 DIAGNOSIS — I509 Heart failure, unspecified: Secondary | ICD-10-CM

## 2018-07-22 DIAGNOSIS — I129 Hypertensive chronic kidney disease with stage 1 through stage 4 chronic kidney disease, or unspecified chronic kidney disease: Secondary | ICD-10-CM | POA: Diagnosis not present

## 2018-07-22 DIAGNOSIS — J849 Interstitial pulmonary disease, unspecified: Secondary | ICD-10-CM | POA: Diagnosis not present

## 2018-07-22 DIAGNOSIS — I482 Chronic atrial fibrillation, unspecified: Secondary | ICD-10-CM | POA: Diagnosis not present

## 2018-07-22 DIAGNOSIS — I4811 Longstanding persistent atrial fibrillation: Secondary | ICD-10-CM

## 2018-07-22 DIAGNOSIS — R0600 Dyspnea, unspecified: Secondary | ICD-10-CM | POA: Diagnosis not present

## 2018-07-22 DIAGNOSIS — R0602 Shortness of breath: Secondary | ICD-10-CM | POA: Diagnosis not present

## 2018-07-22 DIAGNOSIS — J40 Bronchitis, not specified as acute or chronic: Secondary | ICD-10-CM | POA: Diagnosis not present

## 2018-07-23 DIAGNOSIS — I4811 Longstanding persistent atrial fibrillation: Secondary | ICD-10-CM | POA: Diagnosis not present

## 2018-07-23 DIAGNOSIS — R531 Weakness: Secondary | ICD-10-CM | POA: Diagnosis not present

## 2018-07-23 DIAGNOSIS — J849 Interstitial pulmonary disease, unspecified: Secondary | ICD-10-CM | POA: Diagnosis not present

## 2018-07-25 ENCOUNTER — Other Ambulatory Visit: Payer: Self-pay

## 2018-07-25 DIAGNOSIS — M19042 Primary osteoarthritis, left hand: Secondary | ICD-10-CM | POA: Diagnosis not present

## 2018-07-25 DIAGNOSIS — Z9981 Dependence on supplemental oxygen: Secondary | ICD-10-CM | POA: Diagnosis not present

## 2018-07-25 DIAGNOSIS — J441 Chronic obstructive pulmonary disease with (acute) exacerbation: Secondary | ICD-10-CM | POA: Diagnosis not present

## 2018-07-25 DIAGNOSIS — J189 Pneumonia, unspecified organism: Secondary | ICD-10-CM | POA: Diagnosis not present

## 2018-07-25 DIAGNOSIS — Z602 Problems related to living alone: Secondary | ICD-10-CM | POA: Diagnosis not present

## 2018-07-25 DIAGNOSIS — Z7901 Long term (current) use of anticoagulants: Secondary | ICD-10-CM | POA: Diagnosis not present

## 2018-07-25 DIAGNOSIS — J440 Chronic obstructive pulmonary disease with acute lower respiratory infection: Secondary | ICD-10-CM | POA: Diagnosis not present

## 2018-07-25 DIAGNOSIS — M19041 Primary osteoarthritis, right hand: Secondary | ICD-10-CM | POA: Diagnosis not present

## 2018-07-25 DIAGNOSIS — I130 Hypertensive heart and chronic kidney disease with heart failure and stage 1 through stage 4 chronic kidney disease, or unspecified chronic kidney disease: Secondary | ICD-10-CM | POA: Diagnosis not present

## 2018-07-25 DIAGNOSIS — G4733 Obstructive sleep apnea (adult) (pediatric): Secondary | ICD-10-CM | POA: Diagnosis not present

## 2018-07-25 DIAGNOSIS — I4819 Other persistent atrial fibrillation: Secondary | ICD-10-CM | POA: Diagnosis not present

## 2018-07-25 DIAGNOSIS — J8410 Pulmonary fibrosis, unspecified: Secondary | ICD-10-CM | POA: Diagnosis not present

## 2018-07-25 DIAGNOSIS — Z7951 Long term (current) use of inhaled steroids: Secondary | ICD-10-CM | POA: Diagnosis not present

## 2018-07-25 DIAGNOSIS — I5032 Chronic diastolic (congestive) heart failure: Secondary | ICD-10-CM | POA: Diagnosis not present

## 2018-07-25 DIAGNOSIS — N183 Chronic kidney disease, stage 3 (moderate): Secondary | ICD-10-CM | POA: Diagnosis not present

## 2018-07-25 NOTE — Patient Outreach (Signed)
[facility]) Care Management  07/25/2018  [place] 1931-12-23 [id number]   TELEPHONE SCREENING Referral date:07/25/18 Referral source: utilization management Referral reason: medication assistance and home health needs Insurance: Health team advantage  Telephone call to patient regarding utilization management referral. HIPAA verified with patient. Explained reason for call. Patient states she is able to afford her medications at this time and does not have need for any home health assistance.  Patient states she does not have any further needs or concerns.  RNCM offered to mail patient [facility] care management brochure/ magnet for future reference. Patient verbally agreed.   PLAN: RNCm will close patient due to patient being assessed and having no further needs.  RNCM will send patient [facility] care management brochure / magnet RNCM will send patients primary MD closure notification   Quinn Plowman RN,BSN,CCM [facility] Telephonic  [phone]

## 2018-07-26 DIAGNOSIS — R531 Weakness: Secondary | ICD-10-CM | POA: Diagnosis not present

## 2018-07-26 DIAGNOSIS — Z6826 Body mass index (BMI) 26.0-26.9, adult: Secondary | ICD-10-CM | POA: Diagnosis not present

## 2018-07-26 DIAGNOSIS — E663 Overweight: Secondary | ICD-10-CM | POA: Diagnosis not present

## 2018-07-26 DIAGNOSIS — Z79899 Other long term (current) drug therapy: Secondary | ICD-10-CM | POA: Diagnosis not present

## 2018-07-26 DIAGNOSIS — Z09 Encounter for follow-up examination after completed treatment for conditions other than malignant neoplasm: Secondary | ICD-10-CM | POA: Diagnosis not present

## 2018-08-18 DIAGNOSIS — G4733 Obstructive sleep apnea (adult) (pediatric): Secondary | ICD-10-CM | POA: Diagnosis not present

## 2018-08-24 DIAGNOSIS — I130 Hypertensive heart and chronic kidney disease with heart failure and stage 1 through stage 4 chronic kidney disease, or unspecified chronic kidney disease: Secondary | ICD-10-CM | POA: Diagnosis not present

## 2018-08-24 DIAGNOSIS — J8410 Pulmonary fibrosis, unspecified: Secondary | ICD-10-CM | POA: Diagnosis not present

## 2018-08-24 DIAGNOSIS — N183 Chronic kidney disease, stage 3 (moderate): Secondary | ICD-10-CM | POA: Diagnosis not present

## 2018-08-24 DIAGNOSIS — I4819 Other persistent atrial fibrillation: Secondary | ICD-10-CM | POA: Diagnosis not present

## 2018-08-24 DIAGNOSIS — J189 Pneumonia, unspecified organism: Secondary | ICD-10-CM | POA: Diagnosis not present

## 2018-08-24 DIAGNOSIS — Z7951 Long term (current) use of inhaled steroids: Secondary | ICD-10-CM | POA: Diagnosis not present

## 2018-08-24 DIAGNOSIS — M19042 Primary osteoarthritis, left hand: Secondary | ICD-10-CM | POA: Diagnosis not present

## 2018-08-24 DIAGNOSIS — J441 Chronic obstructive pulmonary disease with (acute) exacerbation: Secondary | ICD-10-CM | POA: Diagnosis not present

## 2018-08-24 DIAGNOSIS — Z7901 Long term (current) use of anticoagulants: Secondary | ICD-10-CM | POA: Diagnosis not present

## 2018-08-24 DIAGNOSIS — Z602 Problems related to living alone: Secondary | ICD-10-CM | POA: Diagnosis not present

## 2018-08-24 DIAGNOSIS — M19041 Primary osteoarthritis, right hand: Secondary | ICD-10-CM | POA: Diagnosis not present

## 2018-08-24 DIAGNOSIS — G4733 Obstructive sleep apnea (adult) (pediatric): Secondary | ICD-10-CM | POA: Diagnosis not present

## 2018-08-24 DIAGNOSIS — J440 Chronic obstructive pulmonary disease with acute lower respiratory infection: Secondary | ICD-10-CM | POA: Diagnosis not present

## 2018-08-24 DIAGNOSIS — Z9981 Dependence on supplemental oxygen: Secondary | ICD-10-CM | POA: Diagnosis not present

## 2018-08-24 DIAGNOSIS — I5032 Chronic diastolic (congestive) heart failure: Secondary | ICD-10-CM | POA: Diagnosis not present

## 2018-08-25 DIAGNOSIS — G4733 Obstructive sleep apnea (adult) (pediatric): Secondary | ICD-10-CM | POA: Diagnosis not present

## 2018-09-05 DIAGNOSIS — Z0000 Encounter for general adult medical examination without abnormal findings: Secondary | ICD-10-CM | POA: Diagnosis not present

## 2018-09-05 DIAGNOSIS — Z1339 Encounter for screening examination for other mental health and behavioral disorders: Secondary | ICD-10-CM | POA: Diagnosis not present

## 2018-09-05 DIAGNOSIS — Z6826 Body mass index (BMI) 26.0-26.9, adult: Secondary | ICD-10-CM | POA: Diagnosis not present

## 2018-09-05 DIAGNOSIS — Z1331 Encounter for screening for depression: Secondary | ICD-10-CM | POA: Diagnosis not present

## 2018-09-05 DIAGNOSIS — E663 Overweight: Secondary | ICD-10-CM | POA: Diagnosis not present

## 2018-09-29 DIAGNOSIS — I110 Hypertensive heart disease with heart failure: Secondary | ICD-10-CM | POA: Diagnosis not present

## 2018-09-29 DIAGNOSIS — J849 Interstitial pulmonary disease, unspecified: Secondary | ICD-10-CM | POA: Diagnosis not present

## 2018-09-29 DIAGNOSIS — I5030 Unspecified diastolic (congestive) heart failure: Secondary | ICD-10-CM | POA: Diagnosis not present

## 2018-09-29 DIAGNOSIS — I482 Chronic atrial fibrillation, unspecified: Secondary | ICD-10-CM | POA: Diagnosis not present

## 2018-09-29 DIAGNOSIS — I5032 Chronic diastolic (congestive) heart failure: Secondary | ICD-10-CM | POA: Diagnosis not present

## 2018-11-02 DIAGNOSIS — D869 Sarcoidosis, unspecified: Secondary | ICD-10-CM | POA: Diagnosis not present

## 2018-11-02 DIAGNOSIS — J849 Interstitial pulmonary disease, unspecified: Secondary | ICD-10-CM | POA: Diagnosis not present

## 2018-11-02 DIAGNOSIS — E7800 Pure hypercholesterolemia, unspecified: Secondary | ICD-10-CM | POA: Diagnosis not present

## 2018-11-02 DIAGNOSIS — N183 Chronic kidney disease, stage 3 (moderate): Secondary | ICD-10-CM | POA: Diagnosis not present

## 2018-11-02 DIAGNOSIS — Z9989 Dependence on other enabling machines and devices: Secondary | ICD-10-CM | POA: Diagnosis not present

## 2018-11-02 DIAGNOSIS — I5030 Unspecified diastolic (congestive) heart failure: Secondary | ICD-10-CM | POA: Diagnosis not present

## 2018-11-02 DIAGNOSIS — I5181 Takotsubo syndrome: Secondary | ICD-10-CM | POA: Diagnosis not present

## 2018-11-02 DIAGNOSIS — I110 Hypertensive heart disease with heart failure: Secondary | ICD-10-CM | POA: Diagnosis not present

## 2018-11-02 DIAGNOSIS — G4733 Obstructive sleep apnea (adult) (pediatric): Secondary | ICD-10-CM | POA: Diagnosis not present

## 2018-11-02 DIAGNOSIS — I4819 Other persistent atrial fibrillation: Secondary | ICD-10-CM | POA: Diagnosis not present

## 2018-11-02 DIAGNOSIS — I129 Hypertensive chronic kidney disease with stage 1 through stage 4 chronic kidney disease, or unspecified chronic kidney disease: Secondary | ICD-10-CM | POA: Diagnosis not present

## 2018-11-02 DIAGNOSIS — J8410 Pulmonary fibrosis, unspecified: Secondary | ICD-10-CM | POA: Diagnosis not present

## 2018-11-28 DIAGNOSIS — G4733 Obstructive sleep apnea (adult) (pediatric): Secondary | ICD-10-CM | POA: Diagnosis not present

## 2018-12-30 DIAGNOSIS — H0012 Chalazion right lower eyelid: Secondary | ICD-10-CM | POA: Diagnosis not present

## 2019-01-09 ENCOUNTER — Other Ambulatory Visit: Payer: Self-pay

## 2019-02-27 DIAGNOSIS — G4733 Obstructive sleep apnea (adult) (pediatric): Secondary | ICD-10-CM | POA: Diagnosis not present

## 2019-03-04 DIAGNOSIS — M79662 Pain in left lower leg: Secondary | ICD-10-CM | POA: Diagnosis not present

## 2019-03-04 DIAGNOSIS — M25562 Pain in left knee: Secondary | ICD-10-CM | POA: Diagnosis not present

## 2019-03-04 DIAGNOSIS — M1712 Unilateral primary osteoarthritis, left knee: Secondary | ICD-10-CM | POA: Diagnosis not present

## 2019-03-04 DIAGNOSIS — M25462 Effusion, left knee: Secondary | ICD-10-CM | POA: Diagnosis not present

## 2019-03-06 DIAGNOSIS — H40003 Preglaucoma, unspecified, bilateral: Secondary | ICD-10-CM | POA: Diagnosis not present

## 2019-03-07 DIAGNOSIS — I482 Chronic atrial fibrillation, unspecified: Secondary | ICD-10-CM | POA: Diagnosis not present

## 2019-03-07 DIAGNOSIS — J449 Chronic obstructive pulmonary disease, unspecified: Secondary | ICD-10-CM | POA: Diagnosis not present

## 2019-03-07 DIAGNOSIS — Z79899 Other long term (current) drug therapy: Secondary | ICD-10-CM | POA: Diagnosis not present

## 2019-03-07 DIAGNOSIS — E7800 Pure hypercholesterolemia, unspecified: Secondary | ICD-10-CM | POA: Diagnosis not present

## 2019-03-07 DIAGNOSIS — Z6827 Body mass index (BMI) 27.0-27.9, adult: Secondary | ICD-10-CM | POA: Diagnosis not present

## 2019-03-07 DIAGNOSIS — I10 Essential (primary) hypertension: Secondary | ICD-10-CM | POA: Diagnosis not present

## 2019-03-24 DIAGNOSIS — M25562 Pain in left knee: Secondary | ICD-10-CM | POA: Diagnosis not present

## 2019-03-29 DIAGNOSIS — M25562 Pain in left knee: Secondary | ICD-10-CM | POA: Diagnosis not present

## 2019-03-31 DIAGNOSIS — M1712 Unilateral primary osteoarthritis, left knee: Secondary | ICD-10-CM | POA: Diagnosis not present

## 2019-03-31 DIAGNOSIS — M84362A Stress fracture, left tibia, initial encounter for fracture: Secondary | ICD-10-CM | POA: Diagnosis not present

## 2019-03-31 DIAGNOSIS — M25562 Pain in left knee: Secondary | ICD-10-CM | POA: Diagnosis not present

## 2019-03-31 DIAGNOSIS — S83242A Other tear of medial meniscus, current injury, left knee, initial encounter: Secondary | ICD-10-CM | POA: Diagnosis not present

## 2019-04-11 DIAGNOSIS — Z6827 Body mass index (BMI) 27.0-27.9, adult: Secondary | ICD-10-CM | POA: Diagnosis not present

## 2019-04-11 DIAGNOSIS — Z0289 Encounter for other administrative examinations: Secondary | ICD-10-CM | POA: Diagnosis not present

## 2019-04-11 DIAGNOSIS — N1832 Chronic kidney disease, stage 3b: Secondary | ICD-10-CM | POA: Diagnosis not present

## 2019-04-11 DIAGNOSIS — E663 Overweight: Secondary | ICD-10-CM | POA: Diagnosis not present

## 2019-04-11 DIAGNOSIS — Z1331 Encounter for screening for depression: Secondary | ICD-10-CM | POA: Diagnosis not present

## 2019-04-20 DIAGNOSIS — N183 Chronic kidney disease, stage 3 unspecified: Secondary | ICD-10-CM | POA: Diagnosis not present

## 2019-04-20 DIAGNOSIS — Z9989 Dependence on other enabling machines and devices: Secondary | ICD-10-CM | POA: Diagnosis not present

## 2019-04-20 DIAGNOSIS — I4891 Unspecified atrial fibrillation: Secondary | ICD-10-CM | POA: Diagnosis not present

## 2019-04-20 DIAGNOSIS — I5032 Chronic diastolic (congestive) heart failure: Secondary | ICD-10-CM | POA: Diagnosis not present

## 2019-04-20 DIAGNOSIS — G4733 Obstructive sleep apnea (adult) (pediatric): Secondary | ICD-10-CM | POA: Diagnosis not present

## 2019-04-20 DIAGNOSIS — J449 Chronic obstructive pulmonary disease, unspecified: Secondary | ICD-10-CM | POA: Diagnosis not present

## 2019-04-28 DIAGNOSIS — M1712 Unilateral primary osteoarthritis, left knee: Secondary | ICD-10-CM | POA: Diagnosis not present

## 2019-04-28 DIAGNOSIS — M25562 Pain in left knee: Secondary | ICD-10-CM | POA: Diagnosis not present

## 2019-04-28 DIAGNOSIS — S83242A Other tear of medial meniscus, current injury, left knee, initial encounter: Secondary | ICD-10-CM | POA: Diagnosis not present

## 2019-05-31 DIAGNOSIS — G4733 Obstructive sleep apnea (adult) (pediatric): Secondary | ICD-10-CM | POA: Diagnosis not present

## 2019-06-06 DIAGNOSIS — M1712 Unilateral primary osteoarthritis, left knee: Secondary | ICD-10-CM | POA: Diagnosis not present

## 2019-06-07 DIAGNOSIS — I4819 Other persistent atrial fibrillation: Secondary | ICD-10-CM | POA: Diagnosis not present

## 2019-06-07 DIAGNOSIS — Z9989 Dependence on other enabling machines and devices: Secondary | ICD-10-CM | POA: Diagnosis not present

## 2019-06-07 DIAGNOSIS — Z7901 Long term (current) use of anticoagulants: Secondary | ICD-10-CM | POA: Diagnosis not present

## 2019-06-07 DIAGNOSIS — J849 Interstitial pulmonary disease, unspecified: Secondary | ICD-10-CM | POA: Diagnosis not present

## 2019-06-07 DIAGNOSIS — N183 Chronic kidney disease, stage 3 unspecified: Secondary | ICD-10-CM | POA: Diagnosis not present

## 2019-06-07 DIAGNOSIS — G4733 Obstructive sleep apnea (adult) (pediatric): Secondary | ICD-10-CM | POA: Diagnosis not present

## 2019-08-23 DIAGNOSIS — N1832 Chronic kidney disease, stage 3b: Secondary | ICD-10-CM | POA: Diagnosis not present

## 2019-08-23 DIAGNOSIS — D6869 Other thrombophilia: Secondary | ICD-10-CM | POA: Diagnosis not present

## 2019-08-23 DIAGNOSIS — E7800 Pure hypercholesterolemia, unspecified: Secondary | ICD-10-CM | POA: Diagnosis not present

## 2019-08-23 DIAGNOSIS — Z6828 Body mass index (BMI) 28.0-28.9, adult: Secondary | ICD-10-CM | POA: Diagnosis not present

## 2019-08-23 DIAGNOSIS — I482 Chronic atrial fibrillation, unspecified: Secondary | ICD-10-CM | POA: Diagnosis not present

## 2019-08-23 DIAGNOSIS — Z79899 Other long term (current) drug therapy: Secondary | ICD-10-CM | POA: Diagnosis not present

## 2019-08-23 DIAGNOSIS — J449 Chronic obstructive pulmonary disease, unspecified: Secondary | ICD-10-CM | POA: Diagnosis not present

## 2019-08-23 DIAGNOSIS — I10 Essential (primary) hypertension: Secondary | ICD-10-CM | POA: Diagnosis not present

## 2019-08-28 DIAGNOSIS — H40003 Preglaucoma, unspecified, bilateral: Secondary | ICD-10-CM | POA: Diagnosis not present

## 2019-08-29 DIAGNOSIS — G4733 Obstructive sleep apnea (adult) (pediatric): Secondary | ICD-10-CM | POA: Diagnosis not present

## 2019-10-19 DIAGNOSIS — Z9989 Dependence on other enabling machines and devices: Secondary | ICD-10-CM | POA: Diagnosis not present

## 2019-10-19 DIAGNOSIS — G4733 Obstructive sleep apnea (adult) (pediatric): Secondary | ICD-10-CM | POA: Diagnosis not present

## 2019-10-19 DIAGNOSIS — J431 Panlobular emphysema: Secondary | ICD-10-CM | POA: Diagnosis not present

## 2019-10-19 DIAGNOSIS — I5032 Chronic diastolic (congestive) heart failure: Secondary | ICD-10-CM | POA: Diagnosis not present

## 2019-10-19 DIAGNOSIS — I4819 Other persistent atrial fibrillation: Secondary | ICD-10-CM | POA: Diagnosis not present

## 2019-10-19 DIAGNOSIS — N183 Chronic kidney disease, stage 3 unspecified: Secondary | ICD-10-CM | POA: Diagnosis not present

## 2019-11-03 DIAGNOSIS — J449 Chronic obstructive pulmonary disease, unspecified: Secondary | ICD-10-CM | POA: Diagnosis not present

## 2019-11-03 DIAGNOSIS — J849 Interstitial pulmonary disease, unspecified: Secondary | ICD-10-CM | POA: Diagnosis not present

## 2019-11-03 DIAGNOSIS — Z9989 Dependence on other enabling machines and devices: Secondary | ICD-10-CM | POA: Diagnosis not present

## 2019-11-03 DIAGNOSIS — I5181 Takotsubo syndrome: Secondary | ICD-10-CM | POA: Diagnosis not present

## 2019-11-03 DIAGNOSIS — Z7901 Long term (current) use of anticoagulants: Secondary | ICD-10-CM | POA: Diagnosis not present

## 2019-11-03 DIAGNOSIS — E785 Hyperlipidemia, unspecified: Secondary | ICD-10-CM | POA: Diagnosis not present

## 2019-11-03 DIAGNOSIS — J8410 Pulmonary fibrosis, unspecified: Secondary | ICD-10-CM | POA: Diagnosis not present

## 2019-11-03 DIAGNOSIS — Z79899 Other long term (current) drug therapy: Secondary | ICD-10-CM | POA: Diagnosis not present

## 2019-11-03 DIAGNOSIS — I5030 Unspecified diastolic (congestive) heart failure: Secondary | ICD-10-CM | POA: Diagnosis not present

## 2019-11-03 DIAGNOSIS — G4733 Obstructive sleep apnea (adult) (pediatric): Secondary | ICD-10-CM | POA: Diagnosis not present

## 2019-11-03 DIAGNOSIS — Z7689 Persons encountering health services in other specified circumstances: Secondary | ICD-10-CM | POA: Diagnosis not present

## 2019-11-03 DIAGNOSIS — Z7952 Long term (current) use of systemic steroids: Secondary | ICD-10-CM | POA: Diagnosis not present

## 2019-11-03 DIAGNOSIS — N183 Chronic kidney disease, stage 3 unspecified: Secondary | ICD-10-CM | POA: Diagnosis not present

## 2019-11-03 DIAGNOSIS — I110 Hypertensive heart disease with heart failure: Secondary | ICD-10-CM | POA: Diagnosis not present

## 2019-11-03 DIAGNOSIS — R5383 Other fatigue: Secondary | ICD-10-CM | POA: Diagnosis not present

## 2019-11-03 DIAGNOSIS — I130 Hypertensive heart and chronic kidney disease with heart failure and stage 1 through stage 4 chronic kidney disease, or unspecified chronic kidney disease: Secondary | ICD-10-CM | POA: Diagnosis not present

## 2019-11-03 DIAGNOSIS — I4819 Other persistent atrial fibrillation: Secondary | ICD-10-CM | POA: Diagnosis not present

## 2019-11-03 DIAGNOSIS — I5032 Chronic diastolic (congestive) heart failure: Secondary | ICD-10-CM | POA: Diagnosis not present

## 2019-11-07 DIAGNOSIS — R232 Flushing: Secondary | ICD-10-CM | POA: Diagnosis not present

## 2019-11-07 DIAGNOSIS — R531 Weakness: Secondary | ICD-10-CM | POA: Diagnosis not present

## 2019-11-07 DIAGNOSIS — R209 Unspecified disturbances of skin sensation: Secondary | ICD-10-CM | POA: Diagnosis not present

## 2019-11-22 DIAGNOSIS — I4819 Other persistent atrial fibrillation: Secondary | ICD-10-CM | POA: Diagnosis not present

## 2019-11-22 DIAGNOSIS — Z7689 Persons encountering health services in other specified circumstances: Secondary | ICD-10-CM | POA: Diagnosis not present

## 2019-11-22 DIAGNOSIS — R002 Palpitations: Secondary | ICD-10-CM | POA: Diagnosis not present

## 2019-11-28 DIAGNOSIS — R001 Bradycardia, unspecified: Secondary | ICD-10-CM | POA: Diagnosis not present

## 2019-11-28 DIAGNOSIS — I493 Ventricular premature depolarization: Secondary | ICD-10-CM | POA: Diagnosis not present

## 2019-11-28 DIAGNOSIS — I4891 Unspecified atrial fibrillation: Secondary | ICD-10-CM | POA: Diagnosis not present

## 2019-11-28 DIAGNOSIS — G4733 Obstructive sleep apnea (adult) (pediatric): Secondary | ICD-10-CM | POA: Diagnosis not present

## 2019-11-28 DIAGNOSIS — I471 Supraventricular tachycardia: Secondary | ICD-10-CM | POA: Diagnosis not present

## 2019-12-14 DIAGNOSIS — E161 Other hypoglycemia: Secondary | ICD-10-CM | POA: Diagnosis not present

## 2019-12-20 DIAGNOSIS — M6281 Muscle weakness (generalized): Secondary | ICD-10-CM | POA: Diagnosis not present

## 2019-12-20 DIAGNOSIS — E876 Hypokalemia: Secondary | ICD-10-CM | POA: Diagnosis not present

## 2019-12-20 DIAGNOSIS — E871 Hypo-osmolality and hyponatremia: Secondary | ICD-10-CM | POA: Diagnosis not present

## 2019-12-20 DIAGNOSIS — R6883 Chills (without fever): Secondary | ICD-10-CM | POA: Diagnosis not present

## 2019-12-20 DIAGNOSIS — D649 Anemia, unspecified: Secondary | ICD-10-CM | POA: Diagnosis not present

## 2019-12-21 DIAGNOSIS — I482 Chronic atrial fibrillation, unspecified: Secondary | ICD-10-CM | POA: Diagnosis not present

## 2020-01-01 DIAGNOSIS — H40003 Preglaucoma, unspecified, bilateral: Secondary | ICD-10-CM | POA: Diagnosis not present

## 2020-01-02 ENCOUNTER — Other Ambulatory Visit: Payer: Self-pay

## 2020-01-18 DIAGNOSIS — I4819 Other persistent atrial fibrillation: Secondary | ICD-10-CM | POA: Diagnosis not present

## 2020-01-18 DIAGNOSIS — M8949 Other hypertrophic osteoarthropathy, multiple sites: Secondary | ICD-10-CM | POA: Diagnosis not present

## 2020-01-18 DIAGNOSIS — D869 Sarcoidosis, unspecified: Secondary | ICD-10-CM | POA: Diagnosis not present

## 2020-01-18 DIAGNOSIS — R609 Edema, unspecified: Secondary | ICD-10-CM | POA: Diagnosis not present

## 2020-01-18 DIAGNOSIS — N1832 Chronic kidney disease, stage 3b: Secondary | ICD-10-CM | POA: Diagnosis not present

## 2020-01-18 DIAGNOSIS — I25118 Atherosclerotic heart disease of native coronary artery with other forms of angina pectoris: Secondary | ICD-10-CM | POA: Diagnosis not present

## 2020-01-18 DIAGNOSIS — J431 Panlobular emphysema: Secondary | ICD-10-CM | POA: Diagnosis not present

## 2020-01-18 DIAGNOSIS — J849 Interstitial pulmonary disease, unspecified: Secondary | ICD-10-CM | POA: Diagnosis not present

## 2020-01-18 DIAGNOSIS — I5032 Chronic diastolic (congestive) heart failure: Secondary | ICD-10-CM | POA: Diagnosis not present

## 2020-01-18 DIAGNOSIS — J479 Bronchiectasis, uncomplicated: Secondary | ICD-10-CM | POA: Diagnosis not present

## 2020-01-18 DIAGNOSIS — I130 Hypertensive heart and chronic kidney disease with heart failure and stage 1 through stage 4 chronic kidney disease, or unspecified chronic kidney disease: Secondary | ICD-10-CM | POA: Diagnosis not present

## 2020-01-18 DIAGNOSIS — E782 Mixed hyperlipidemia: Secondary | ICD-10-CM | POA: Diagnosis not present

## 2020-01-31 DIAGNOSIS — Z9989 Dependence on other enabling machines and devices: Secondary | ICD-10-CM | POA: Diagnosis not present

## 2020-01-31 DIAGNOSIS — J849 Interstitial pulmonary disease, unspecified: Secondary | ICD-10-CM | POA: Diagnosis not present

## 2020-01-31 DIAGNOSIS — I129 Hypertensive chronic kidney disease with stage 1 through stage 4 chronic kidney disease, or unspecified chronic kidney disease: Secondary | ICD-10-CM | POA: Diagnosis not present

## 2020-01-31 DIAGNOSIS — I4819 Other persistent atrial fibrillation: Secondary | ICD-10-CM | POA: Diagnosis not present

## 2020-01-31 DIAGNOSIS — D869 Sarcoidosis, unspecified: Secondary | ICD-10-CM | POA: Diagnosis not present

## 2020-01-31 DIAGNOSIS — J431 Panlobular emphysema: Secondary | ICD-10-CM | POA: Diagnosis not present

## 2020-01-31 DIAGNOSIS — I5181 Takotsubo syndrome: Secondary | ICD-10-CM | POA: Diagnosis not present

## 2020-01-31 DIAGNOSIS — E782 Mixed hyperlipidemia: Secondary | ICD-10-CM | POA: Diagnosis not present

## 2020-01-31 DIAGNOSIS — N1832 Chronic kidney disease, stage 3b: Secondary | ICD-10-CM | POA: Diagnosis not present

## 2020-01-31 DIAGNOSIS — G4733 Obstructive sleep apnea (adult) (pediatric): Secondary | ICD-10-CM | POA: Diagnosis not present

## 2020-02-05 DIAGNOSIS — R000 Tachycardia, unspecified: Secondary | ICD-10-CM | POA: Diagnosis not present

## 2020-02-20 DIAGNOSIS — I4819 Other persistent atrial fibrillation: Secondary | ICD-10-CM | POA: Diagnosis not present

## 2020-02-26 DIAGNOSIS — G4733 Obstructive sleep apnea (adult) (pediatric): Secondary | ICD-10-CM | POA: Diagnosis not present

## 2020-04-25 DIAGNOSIS — I4819 Other persistent atrial fibrillation: Secondary | ICD-10-CM | POA: Diagnosis not present

## 2020-04-25 DIAGNOSIS — J849 Interstitial pulmonary disease, unspecified: Secondary | ICD-10-CM | POA: Diagnosis not present

## 2020-04-25 DIAGNOSIS — Z9989 Dependence on other enabling machines and devices: Secondary | ICD-10-CM | POA: Diagnosis not present

## 2020-04-25 DIAGNOSIS — G4733 Obstructive sleep apnea (adult) (pediatric): Secondary | ICD-10-CM | POA: Diagnosis not present

## 2020-04-25 DIAGNOSIS — I5032 Chronic diastolic (congestive) heart failure: Secondary | ICD-10-CM | POA: Diagnosis not present

## 2020-05-20 DIAGNOSIS — D869 Sarcoidosis, unspecified: Secondary | ICD-10-CM | POA: Diagnosis not present

## 2020-05-20 DIAGNOSIS — J849 Interstitial pulmonary disease, unspecified: Secondary | ICD-10-CM | POA: Diagnosis not present

## 2020-05-20 DIAGNOSIS — J431 Panlobular emphysema: Secondary | ICD-10-CM | POA: Diagnosis not present

## 2020-05-20 DIAGNOSIS — I10 Essential (primary) hypertension: Secondary | ICD-10-CM | POA: Diagnosis not present

## 2020-05-20 DIAGNOSIS — R609 Edema, unspecified: Secondary | ICD-10-CM | POA: Diagnosis not present

## 2020-05-20 DIAGNOSIS — J479 Bronchiectasis, uncomplicated: Secondary | ICD-10-CM | POA: Diagnosis not present

## 2020-05-20 DIAGNOSIS — E782 Mixed hyperlipidemia: Secondary | ICD-10-CM | POA: Diagnosis not present

## 2020-05-20 DIAGNOSIS — Z0000 Encounter for general adult medical examination without abnormal findings: Secondary | ICD-10-CM | POA: Diagnosis not present

## 2020-05-20 DIAGNOSIS — N1832 Chronic kidney disease, stage 3b: Secondary | ICD-10-CM | POA: Diagnosis not present

## 2020-05-20 DIAGNOSIS — I130 Hypertensive heart and chronic kidney disease with heart failure and stage 1 through stage 4 chronic kidney disease, or unspecified chronic kidney disease: Secondary | ICD-10-CM | POA: Diagnosis not present

## 2020-05-20 DIAGNOSIS — M8949 Other hypertrophic osteoarthropathy, multiple sites: Secondary | ICD-10-CM | POA: Diagnosis not present

## 2020-05-20 DIAGNOSIS — I25118 Atherosclerotic heart disease of native coronary artery with other forms of angina pectoris: Secondary | ICD-10-CM | POA: Diagnosis not present

## 2020-05-20 DIAGNOSIS — I5032 Chronic diastolic (congestive) heart failure: Secondary | ICD-10-CM | POA: Diagnosis not present

## 2020-05-20 DIAGNOSIS — F5101 Primary insomnia: Secondary | ICD-10-CM | POA: Diagnosis not present

## 2020-05-27 DIAGNOSIS — G4733 Obstructive sleep apnea (adult) (pediatric): Secondary | ICD-10-CM | POA: Diagnosis not present

## 2020-05-28 DIAGNOSIS — J018 Other acute sinusitis: Secondary | ICD-10-CM | POA: Diagnosis not present

## 2020-06-04 DIAGNOSIS — J019 Acute sinusitis, unspecified: Secondary | ICD-10-CM | POA: Diagnosis not present

## 2020-06-05 DIAGNOSIS — I25118 Atherosclerotic heart disease of native coronary artery with other forms of angina pectoris: Secondary | ICD-10-CM | POA: Diagnosis not present

## 2020-06-05 DIAGNOSIS — D869 Sarcoidosis, unspecified: Secondary | ICD-10-CM | POA: Diagnosis not present

## 2020-06-05 DIAGNOSIS — I5181 Takotsubo syndrome: Secondary | ICD-10-CM | POA: Diagnosis not present

## 2020-06-05 DIAGNOSIS — I129 Hypertensive chronic kidney disease with stage 1 through stage 4 chronic kidney disease, or unspecified chronic kidney disease: Secondary | ICD-10-CM | POA: Diagnosis not present

## 2020-06-05 DIAGNOSIS — J431 Panlobular emphysema: Secondary | ICD-10-CM | POA: Diagnosis not present

## 2020-06-05 DIAGNOSIS — N1832 Chronic kidney disease, stage 3b: Secondary | ICD-10-CM | POA: Diagnosis not present

## 2020-06-05 DIAGNOSIS — J849 Interstitial pulmonary disease, unspecified: Secondary | ICD-10-CM | POA: Diagnosis not present

## 2020-06-05 DIAGNOSIS — R609 Edema, unspecified: Secondary | ICD-10-CM | POA: Diagnosis not present

## 2020-06-05 DIAGNOSIS — Z7901 Long term (current) use of anticoagulants: Secondary | ICD-10-CM | POA: Diagnosis not present

## 2020-06-05 DIAGNOSIS — Z9989 Dependence on other enabling machines and devices: Secondary | ICD-10-CM | POA: Diagnosis not present

## 2020-06-05 DIAGNOSIS — G4733 Obstructive sleep apnea (adult) (pediatric): Secondary | ICD-10-CM | POA: Diagnosis not present

## 2020-06-05 DIAGNOSIS — I4819 Other persistent atrial fibrillation: Secondary | ICD-10-CM | POA: Diagnosis not present

## 2020-07-01 DIAGNOSIS — H40013 Open angle with borderline findings, low risk, bilateral: Secondary | ICD-10-CM | POA: Diagnosis not present

## 2020-08-26 DIAGNOSIS — G4733 Obstructive sleep apnea (adult) (pediatric): Secondary | ICD-10-CM | POA: Diagnosis not present

## 2020-09-12 DIAGNOSIS — C44319 Basal cell carcinoma of skin of other parts of face: Secondary | ICD-10-CM | POA: Diagnosis not present

## 2020-09-12 DIAGNOSIS — C44311 Basal cell carcinoma of skin of nose: Secondary | ICD-10-CM | POA: Diagnosis not present

## 2020-09-12 DIAGNOSIS — C44622 Squamous cell carcinoma of skin of right upper limb, including shoulder: Secondary | ICD-10-CM | POA: Diagnosis not present

## 2020-09-12 DIAGNOSIS — L570 Actinic keratosis: Secondary | ICD-10-CM | POA: Diagnosis not present

## 2020-09-12 DIAGNOSIS — L578 Other skin changes due to chronic exposure to nonionizing radiation: Secondary | ICD-10-CM | POA: Diagnosis not present

## 2020-09-12 DIAGNOSIS — L821 Other seborrheic keratosis: Secondary | ICD-10-CM | POA: Diagnosis not present

## 2020-09-12 DIAGNOSIS — C44722 Squamous cell carcinoma of skin of right lower limb, including hip: Secondary | ICD-10-CM | POA: Diagnosis not present

## 2020-09-16 DIAGNOSIS — I5032 Chronic diastolic (congestive) heart failure: Secondary | ICD-10-CM | POA: Diagnosis not present

## 2020-09-16 DIAGNOSIS — I5181 Takotsubo syndrome: Secondary | ICD-10-CM | POA: Diagnosis not present

## 2020-09-16 DIAGNOSIS — Z79899 Other long term (current) drug therapy: Secondary | ICD-10-CM | POA: Diagnosis not present

## 2020-09-16 DIAGNOSIS — I4819 Other persistent atrial fibrillation: Secondary | ICD-10-CM | POA: Diagnosis not present

## 2020-09-16 DIAGNOSIS — I25118 Atherosclerotic heart disease of native coronary artery with other forms of angina pectoris: Secondary | ICD-10-CM | POA: Diagnosis not present

## 2020-09-16 DIAGNOSIS — I4811 Longstanding persistent atrial fibrillation: Secondary | ICD-10-CM | POA: Diagnosis not present

## 2020-09-16 DIAGNOSIS — Z5181 Encounter for therapeutic drug level monitoring: Secondary | ICD-10-CM | POA: Diagnosis not present

## 2020-09-16 DIAGNOSIS — Z7901 Long term (current) use of anticoagulants: Secondary | ICD-10-CM | POA: Diagnosis not present

## 2020-09-27 DIAGNOSIS — C44722 Squamous cell carcinoma of skin of right lower limb, including hip: Secondary | ICD-10-CM | POA: Diagnosis not present

## 2020-10-10 DIAGNOSIS — C44612 Basal cell carcinoma of skin of right upper limb, including shoulder: Secondary | ICD-10-CM | POA: Diagnosis not present

## 2020-10-10 DIAGNOSIS — C44321 Squamous cell carcinoma of skin of nose: Secondary | ICD-10-CM | POA: Diagnosis not present

## 2020-10-24 DIAGNOSIS — C44319 Basal cell carcinoma of skin of other parts of face: Secondary | ICD-10-CM | POA: Diagnosis not present

## 2020-11-19 DIAGNOSIS — I129 Hypertensive chronic kidney disease with stage 1 through stage 4 chronic kidney disease, or unspecified chronic kidney disease: Secondary | ICD-10-CM | POA: Diagnosis not present

## 2020-11-19 DIAGNOSIS — G4733 Obstructive sleep apnea (adult) (pediatric): Secondary | ICD-10-CM | POA: Diagnosis not present

## 2020-11-19 DIAGNOSIS — Z9989 Dependence on other enabling machines and devices: Secondary | ICD-10-CM | POA: Diagnosis not present

## 2020-11-19 DIAGNOSIS — I25118 Atherosclerotic heart disease of native coronary artery with other forms of angina pectoris: Secondary | ICD-10-CM | POA: Diagnosis not present

## 2020-11-19 DIAGNOSIS — N1832 Chronic kidney disease, stage 3b: Secondary | ICD-10-CM | POA: Diagnosis not present

## 2020-11-19 DIAGNOSIS — E782 Mixed hyperlipidemia: Secondary | ICD-10-CM | POA: Diagnosis not present

## 2020-11-19 DIAGNOSIS — M159 Polyosteoarthritis, unspecified: Secondary | ICD-10-CM | POA: Diagnosis not present

## 2020-11-19 DIAGNOSIS — I4819 Other persistent atrial fibrillation: Secondary | ICD-10-CM | POA: Diagnosis not present

## 2020-11-19 DIAGNOSIS — J849 Interstitial pulmonary disease, unspecified: Secondary | ICD-10-CM | POA: Diagnosis not present

## 2020-11-19 DIAGNOSIS — J431 Panlobular emphysema: Secondary | ICD-10-CM | POA: Diagnosis not present

## 2020-11-19 DIAGNOSIS — I5032 Chronic diastolic (congestive) heart failure: Secondary | ICD-10-CM | POA: Diagnosis not present

## 2020-11-19 DIAGNOSIS — J479 Bronchiectasis, uncomplicated: Secondary | ICD-10-CM | POA: Diagnosis not present

## 2020-11-27 DIAGNOSIS — G4733 Obstructive sleep apnea (adult) (pediatric): Secondary | ICD-10-CM | POA: Diagnosis not present

## 2020-12-04 DIAGNOSIS — I4891 Unspecified atrial fibrillation: Secondary | ICD-10-CM | POA: Diagnosis not present

## 2020-12-04 DIAGNOSIS — I5032 Chronic diastolic (congestive) heart failure: Secondary | ICD-10-CM | POA: Diagnosis not present

## 2020-12-04 DIAGNOSIS — I4510 Unspecified right bundle-branch block: Secondary | ICD-10-CM | POA: Diagnosis not present

## 2020-12-04 DIAGNOSIS — I4819 Other persistent atrial fibrillation: Secondary | ICD-10-CM | POA: Diagnosis not present

## 2021-01-20 DIAGNOSIS — C44319 Basal cell carcinoma of skin of other parts of face: Secondary | ICD-10-CM | POA: Diagnosis not present

## 2021-01-20 DIAGNOSIS — L821 Other seborrheic keratosis: Secondary | ICD-10-CM | POA: Diagnosis not present

## 2021-01-20 DIAGNOSIS — L578 Other skin changes due to chronic exposure to nonionizing radiation: Secondary | ICD-10-CM | POA: Diagnosis not present

## 2021-01-20 DIAGNOSIS — C44321 Squamous cell carcinoma of skin of nose: Secondary | ICD-10-CM | POA: Diagnosis not present

## 2021-01-20 DIAGNOSIS — L570 Actinic keratosis: Secondary | ICD-10-CM | POA: Diagnosis not present

## 2021-02-26 DIAGNOSIS — G4733 Obstructive sleep apnea (adult) (pediatric): Secondary | ICD-10-CM | POA: Diagnosis not present

## 2021-03-31 DIAGNOSIS — H40013 Open angle with borderline findings, low risk, bilateral: Secondary | ICD-10-CM | POA: Diagnosis not present

## 2021-04-07 DIAGNOSIS — I4819 Other persistent atrial fibrillation: Secondary | ICD-10-CM | POA: Diagnosis not present

## 2021-04-07 DIAGNOSIS — I4510 Unspecified right bundle-branch block: Secondary | ICD-10-CM | POA: Diagnosis not present

## 2021-04-07 DIAGNOSIS — I25118 Atherosclerotic heart disease of native coronary artery with other forms of angina pectoris: Secondary | ICD-10-CM | POA: Diagnosis not present

## 2021-04-07 DIAGNOSIS — I5032 Chronic diastolic (congestive) heart failure: Secondary | ICD-10-CM | POA: Diagnosis not present

## 2021-04-07 DIAGNOSIS — I110 Hypertensive heart disease with heart failure: Secondary | ICD-10-CM | POA: Diagnosis not present

## 2021-04-07 DIAGNOSIS — Z7901 Long term (current) use of anticoagulants: Secondary | ICD-10-CM | POA: Diagnosis not present

## 2021-04-07 DIAGNOSIS — I4891 Unspecified atrial fibrillation: Secondary | ICD-10-CM | POA: Diagnosis not present

## 2021-04-07 DIAGNOSIS — E782 Mixed hyperlipidemia: Secondary | ICD-10-CM | POA: Diagnosis not present

## 2021-04-07 DIAGNOSIS — R609 Edema, unspecified: Secondary | ICD-10-CM | POA: Diagnosis not present

## 2021-04-19 DIAGNOSIS — J441 Chronic obstructive pulmonary disease with (acute) exacerbation: Secondary | ICD-10-CM | POA: Diagnosis not present

## 2021-04-24 DIAGNOSIS — I4819 Other persistent atrial fibrillation: Secondary | ICD-10-CM | POA: Diagnosis not present

## 2021-04-24 DIAGNOSIS — Z9989 Dependence on other enabling machines and devices: Secondary | ICD-10-CM | POA: Diagnosis not present

## 2021-04-24 DIAGNOSIS — J849 Interstitial pulmonary disease, unspecified: Secondary | ICD-10-CM | POA: Diagnosis not present

## 2021-04-24 DIAGNOSIS — G4733 Obstructive sleep apnea (adult) (pediatric): Secondary | ICD-10-CM | POA: Diagnosis not present

## 2021-04-24 DIAGNOSIS — I5032 Chronic diastolic (congestive) heart failure: Secondary | ICD-10-CM | POA: Diagnosis not present

## 2024-06-26 ENCOUNTER — Ambulatory Visit (HOSPITAL_BASED_OUTPATIENT_CLINIC_OR_DEPARTMENT_OTHER): Admission: EM | Admit: 2024-06-26 | Discharge: 2024-06-26 | Disposition: A | Discharge status: Home / Self Care

## 2024-06-26 ENCOUNTER — Ambulatory Visit (HOSPITAL_BASED_OUTPATIENT_CLINIC_OR_DEPARTMENT_OTHER): Payer: Self-pay

## 2024-06-26 ENCOUNTER — Other Ambulatory Visit (HOSPITAL_BASED_OUTPATIENT_CLINIC_OR_DEPARTMENT_OTHER): Payer: Self-pay

## 2024-06-26 ENCOUNTER — Encounter (HOSPITAL_BASED_OUTPATIENT_CLINIC_OR_DEPARTMENT_OTHER): Payer: Self-pay

## 2024-06-26 DIAGNOSIS — J441 Chronic obstructive pulmonary disease with (acute) exacerbation: Secondary | ICD-10-CM | POA: Diagnosis not present

## 2024-06-26 DIAGNOSIS — J0140 Acute pansinusitis, unspecified: Secondary | ICD-10-CM | POA: Diagnosis not present

## 2024-06-26 MED ORDER — PREDNISONE 20 MG PO TABS
20.0000 mg | ORAL_TABLET | Freq: Every day | ORAL | 0 refills | Status: AC
  Filled 2024-06-26: qty 5, 5d supply, fill #0

## 2024-06-26 MED ORDER — CEPHALEXIN 500 MG PO CAPS
500.0000 mg | ORAL_CAPSULE | Freq: Three times a day (TID) | ORAL | 0 refills | Status: AC
  Filled 2024-06-26: qty 21, 7d supply, fill #0

## 2024-06-26 NOTE — ED Triage Notes (Signed)
 Pt c/o nasal congestion, fatigue, and chills since yesterday. Reports hx of COPD and uses her albuterol  inhaler bid. She has also been taking dayquil, Claritin, Flonase, and mucinex with slight relief.

## 2024-06-26 NOTE — ED Provider Notes (Signed)
 " [name]    CSN: [id number] Arrival date & time: 06/26/24  1408      History   Chief Complaint Chief Complaint  Patient presents with   Nasal Congestion    HPI Rebecca Robinson is a 89 y.o. female.   47 old female with complaint of nasal congestion, cough, fatigue, chills and some shortness of breath.  Symptoms started on 06/25/2024.  She does have COPD and uses an albuterol  inhaler at least twice daily.  She is using Claritin, Flonase nasal spray, Mucinex and DayQuil.  All of the over-the-counter medications have been helpful but her cough persists.  She is concerned she might have bronchitis or early pneumonia.     Past Medical History:  Diagnosis Date   Abnormal heart rhythm    Anxiety    CHF (congestive heart failure) (HCC)    Dyspnea    High cholesterol    Hypertension    Interstitial lung disease (HCC)    Pneumonia    Sleep apnea     Patient Active Problem List   Diagnosis Date Noted   Dyspnea 01/07/2015   Postinflammatory pulmonary fibrosis (HCC) 01/07/2015   Bronchospasm 02/27/2014   Essential hypertension 12/05/2013   Atrial fibrillation (HCC) 10/23/2013    Past Surgical History:  Procedure Laterality Date   ABDOMINAL HYSTERECTOMY     ACHILLES TENDON SURGERY Bilateral    BACK SURGERY      OB History   No obstetric history on file.      Home Medications    Prior to Admission medications  Medication Sig Start Date End Date Taking? Authorizing Provider  albuterol  (VENTOLIN  HFA) 108 (90 Base) MCG/ACT inhaler Inhale 2 puffs into the lungs. 07/23/22  Yes [provider]  cephALEXin  (KEFLEX ) 500 MG capsule Take 1 capsule (500 mg total) by mouth 3 (three) times daily for 7 days. 06/26/24 07/03/24 Yes Ival Domino, FNP  diltiazem (CARDIZEM) 30 MG tablet Take 30 mg by mouth. 12/15/23 12/14/24 Yes [provider]  predniSONE  (DELTASONE ) 20 MG tablet Take 1 tablet (20 mg total) by mouth daily with breakfast for 5 days. 06/26/24  07/01/24 Yes Ival Domino, FNP  zolpidem (AMBIEN) 10 MG tablet Take 5-10 mg by mouth at bedtime. 07/23/22  Yes [provider]  ARNUITY ELLIPTA 200 MCG/ACT AEPB Inhale 1 puff into the lungs daily.    [provider]  ELIQUIS  5 MG TABS tablet Take 5 mg by mouth 2 (two) times daily.    [provider]  fluticasone (FLOVENT HFA) 220 MCG/ACT inhaler Inhale 2 puffs into the lungs 2 (two) times daily.    [provider]  furosemide (LASIX) 20 MG tablet Take 20 mg by mouth daily.    [provider]  nitroGLYCERIN (NITROSTAT) 0.4 MG SL tablet Place 1 tablet under the tongue as needed.    [provider]    Family History Family History  Problem Relation Age of Onset   Ovarian cancer Mother    Breast cancer Mother    Emphysema Father     Social History Social History[1]   Allergies   Doxycycline, Levofloxacin, Oxycodone, Amiodarone, Lipitor [atorvastatin], Niacin and related, Propafenone, and Tape   Review of Systems Review of Systems  Constitutional:  Positive for chills and fatigue. Negative for fever.  HENT:  Positive for congestion, postnasal drip and rhinorrhea. Negative for ear pain and sore throat.   Eyes:  Negative for pain and visual disturbance.  Respiratory:  Positive for cough, chest  tightness and shortness of breath.   Cardiovascular:  Negative for chest pain and palpitations.  Gastrointestinal:  Negative for abdominal pain, constipation, diarrhea, nausea and vomiting.  Genitourinary:  Negative for dysuria and hematuria.  Musculoskeletal:  Negative for arthralgias and back pain.  Skin:  Negative for color change and rash.  Neurological:  Negative for seizures and syncope.  All other systems reviewed and are negative.    Physical Exam Triage Vital Signs ED Triage Vitals  Encounter Vitals Group     BP 06/26/24 1511 (!) 146/85     Girls Systolic BP Percentile --      Girls Diastolic BP Percentile --      Boys  Systolic BP Percentile --      Boys Diastolic BP Percentile --      Pulse Rate 06/26/24 1511 91     Resp 06/26/24 1511 20     Temp 06/26/24 1511 98 F (36.7 C)     Temp Source 06/26/24 1511 Oral     SpO2 06/26/24 1511 93 %     Weight --      Height --      Head Circumference --      Peak Flow --      Pain Score 06/26/24 1505 0     Pain Loc --      Pain Education --      Exclude from Growth Chart --    No data found.  Updated Vital Signs BP (!) 146/85 (BP Location: Right Arm)   Pulse 91   Temp 98 F (36.7 C) (Oral)   Resp 20   SpO2 93%   Visual Acuity Right Eye Distance:   Left Eye Distance:   Bilateral Distance:    Right Eye Near:   Left Eye Near:    Bilateral Near:     Physical Exam Vitals and nursing note reviewed.  Constitutional:      General: She is not in acute distress.    Appearance: She is well-developed. She is not ill-appearing or toxic-appearing.  HENT:     Head: Normocephalic and atraumatic.     Right Ear: Hearing, tympanic membrane, ear canal and external ear normal.     Left Ear: Hearing, tympanic membrane, ear canal and external ear normal.     Nose: Congestion and rhinorrhea present. Rhinorrhea is clear.     Right Sinus: Maxillary sinus tenderness and frontal sinus tenderness present.     Left Sinus: Maxillary sinus tenderness and frontal sinus tenderness present.     Mouth/Throat:     Lips: Pink.     Mouth: Mucous membranes are moist.     Pharynx: Uvula midline. No oropharyngeal exudate or posterior oropharyngeal erythema.     Tonsils: No tonsillar exudate.  Eyes:     Conjunctiva/sclera: Conjunctivae normal.     Pupils: Pupils are equal, round, and reactive to light.  Cardiovascular:     Rate and Rhythm: Normal rate and regular rhythm.     Heart sounds: S1 normal and S2 normal. No murmur heard. Pulmonary:     Effort: Pulmonary effort is normal. No respiratory distress.     Breath sounds: Examination of the right-upper field reveals  wheezing and rhonchi. Examination of the left-upper field reveals wheezing. Examination of the right-middle field reveals rhonchi. Wheezing (Mild and intermittent) and rhonchi (Mild and intermittent) present. No decreased breath sounds or rales.     Comments: Oxygen  saturation is 95-96% on room air. Abdominal:     General:  Bowel sounds are normal.     Palpations: Abdomen is soft.     Tenderness: There is no abdominal tenderness.  Musculoskeletal:        General: No swelling.     Cervical back: Neck supple.  Lymphadenopathy:     Head:     Right side of head: No submental, submandibular, tonsillar, preauricular or posterior auricular adenopathy.     Left side of head: No submental, submandibular, tonsillar, preauricular or posterior auricular adenopathy.     Cervical: No cervical adenopathy.     Right cervical: No superficial cervical adenopathy.    Left cervical: No superficial cervical adenopathy.  Skin:    General: Skin is warm and dry.     Capillary Refill: Capillary refill takes less than 2 seconds.     Findings: No rash.  Neurological:     Mental Status: She is alert and oriented to person, place, and time.  Psychiatric:        Mood and Affect: Mood normal.      UC Treatments / Results  Labs (all labs ordered are listed, but only abnormal results are displayed) Labs Reviewed - No data to display  EKG   Radiology No results found.  Procedures Procedures (including critical care time)  Medications Ordered in UC Medications - No data to display  Initial Impression / Assessment and Plan / UC Course  I have reviewed the triage vital signs and the nursing notes.  Pertinent labs & imaging results that were available during my care of the patient were reviewed by me and considered in my medical decision making (see chart for details).  Plan of Care (see discharge instructions for additional patient precautions and education): COPD exacerbation versus early bronchitis  and early sinusitis: Continue albuterol  inhaler and Arnuity inhaler.  Prednisone  20 mg daily for 5 days.  Cephalexin  500 mg, 2 pills twice daily for 7 days.  Get plenty of fluids and rest.  Discussed the need for a possible chest x-ray given her oxygen  saturation and the short duration of her symptoms, I do not believe a chest x-ray is needed at this time.  Follow-up if symptoms do not improve, worsen or new symptoms occur.  I reviewed the plan of care with the patient and/or the patient's guardian.  The patient and/or guardian had time to ask questions and acknowledged that the questions were answered.  Final Clinical Impressions(s) / UC Diagnoses   Final diagnoses:  COPD exacerbation (HCC)  Acute non-recurrent pansinusitis     Discharge Instructions      COPD exacerbation versus early bronchitis and early sinusitis: Continue albuterol  inhaler and Arnuity inhaler.  Prednisone  20 mg daily for 5 days.  Cephalexin  500 mg, 2 pills twice daily for 7 days.  Get plenty of fluids and rest.  Follow-up if symptoms do not improve, worsen or new symptoms occur.     ED Prescriptions     Medication Sig Dispense Auth. Provider   predniSONE  (DELTASONE ) 20 MG tablet Take 1 tablet (20 mg total) by mouth daily with breakfast for 5 days. 5 tablet Quantavius Humm, FNP   cephALEXin  (KEFLEX ) 500 MG capsule Take 1 capsule (500 mg total) by mouth 3 (three) times daily for 7 days. 21 capsule Ival Domino, FNP      PDMP not reviewed this encounter.     [1]  Social History Tobacco Use   Smoking status: Never   Smokeless tobacco: Never  Vaping Use   Vaping status: Never Used  Substance  Use Topics   Alcohol  use: Yes    Alcohol /week: 1.0 standard drink of alcohol     Types: 1 Standard drinks or equivalent per week   Drug use: No     Ival Domino, FNP 06/27/24 2042  "

## 2024-06-26 NOTE — Discharge Instructions (Addendum)
 COPD exacerbation versus early bronchitis and early sinusitis: Continue albuterol  inhaler and Arnuity inhaler.  Prednisone  20 mg daily for 5 days.  Cephalexin  500 mg, 2 pills twice daily for 7 days.  Get plenty of fluids and rest.  Follow-up if symptoms do not improve, worsen or new symptoms occur.
